# Patient Record
Sex: Male | Born: 1945 | Race: White | Hispanic: No | Marital: Married | Smoking: Former smoker
Health system: Southern US, Community
[De-identification: ages and names within clinical notes are randomized; demographics above are authoritative.]

## PROBLEM LIST (undated history)

## (undated) DIAGNOSIS — H35341 Macular cyst, hole, or pseudohole, right eye: Secondary | ICD-10-CM

## (undated) DIAGNOSIS — Z96652 Presence of left artificial knee joint: Secondary | ICD-10-CM

## (undated) DIAGNOSIS — H33011 Retinal detachment with single break, right eye: Secondary | ICD-10-CM

## (undated) DIAGNOSIS — E785 Hyperlipidemia, unspecified: Secondary | ICD-10-CM

## (undated) DIAGNOSIS — N400 Enlarged prostate without lower urinary tract symptoms: Secondary | ICD-10-CM

## (undated) DIAGNOSIS — R269 Unspecified abnormalities of gait and mobility: Secondary | ICD-10-CM

## (undated) DIAGNOSIS — R972 Elevated prostate specific antigen [PSA]: Secondary | ICD-10-CM

## (undated) DIAGNOSIS — H33321 Round hole, right eye: Secondary | ICD-10-CM

## (undated) DIAGNOSIS — G4730 Sleep apnea, unspecified: Secondary | ICD-10-CM

## (undated) DIAGNOSIS — N529 Male erectile dysfunction, unspecified: Secondary | ICD-10-CM

## (undated) DIAGNOSIS — M1711 Unilateral primary osteoarthritis, right knee: Secondary | ICD-10-CM

## (undated) HISTORY — DX: Male erectile dysfunction, unspecified: N52.9

## (undated) HISTORY — DX: Macular cyst, hole, or pseudohole, right eye: H35.341

## (undated) HISTORY — PX: RETINAL DETACHMENT SURGERY: SHX105

## (undated) HISTORY — PX: LIVER CYST REMOVAL: SHX5951

## (undated) HISTORY — PX: CATARACT EXTRACTION: SUR2

## (undated) HISTORY — DX: Unilateral primary osteoarthritis, right knee: M17.11

## (undated) HISTORY — DX: Benign prostatic hyperplasia without lower urinary tract symptoms: N40.0

## (undated) HISTORY — DX: Retinal detachment with single break, right eye: H33.011

## (undated) HISTORY — PX: HERNIA REPAIR: SHX51

## (undated) HISTORY — DX: Unspecified abnormalities of gait and mobility: R26.9

## (undated) HISTORY — DX: Presence of left artificial knee joint: Z96.652

## (undated) HISTORY — DX: Elevated prostate specific antigen (PSA): R97.20

## (undated) HISTORY — PX: OTHER SURGICAL HISTORY: SHX169

## (undated) HISTORY — DX: Hyperlipidemia, unspecified: E78.5

## (undated) HISTORY — DX: Round hole, right eye: H33.321

## (undated) HISTORY — DX: Sleep apnea, unspecified: G47.30

---

## 2004-09-27 ENCOUNTER — Ambulatory Visit: Payer: Self-pay | Admitting: Occupational Therapy

## 2009-04-16 ENCOUNTER — Ambulatory Visit: Payer: Self-pay | Admitting: Nurse Practitioner

## 2010-02-16 ENCOUNTER — Ambulatory Visit: Payer: Self-pay | Admitting: General Surgery

## 2010-02-16 IMAGING — CT CT ABDOMEN W/ CM
1 of 2 series · 15 of 32 positions shown, 19 images · non-contrast
Comparison: none

REASON FOR EXAM: INCISIONAL HERNIA WITH HEPATIC CYST
COMMENTS:

[Series 2: abd with 5.0 i40f · axial · 0.82mm/px · z∈[-906,-612]mm · 15 of 65 slices shown, 19 images]
[im 3/65  soft-tissue]
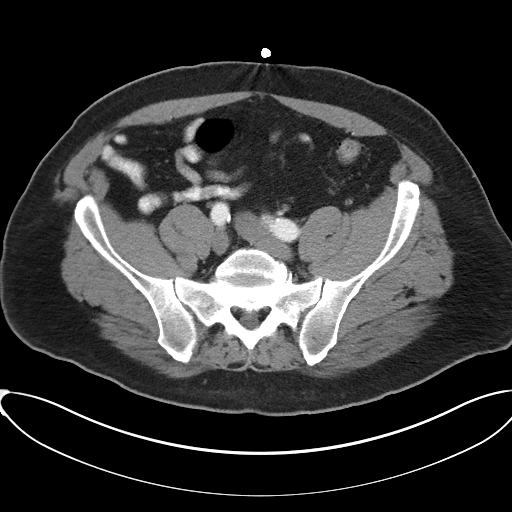
[im 3/65  bone]
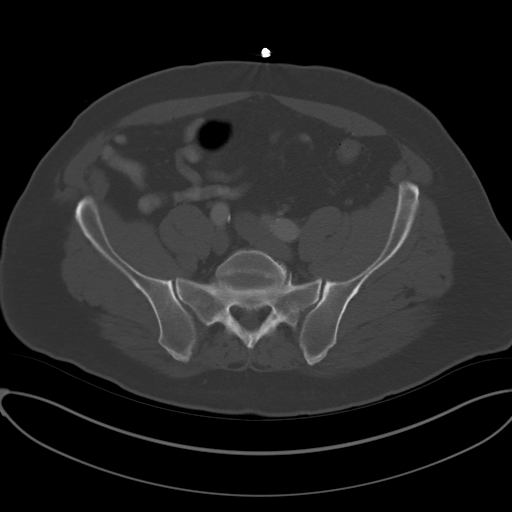
[im 9/65  soft-tissue]
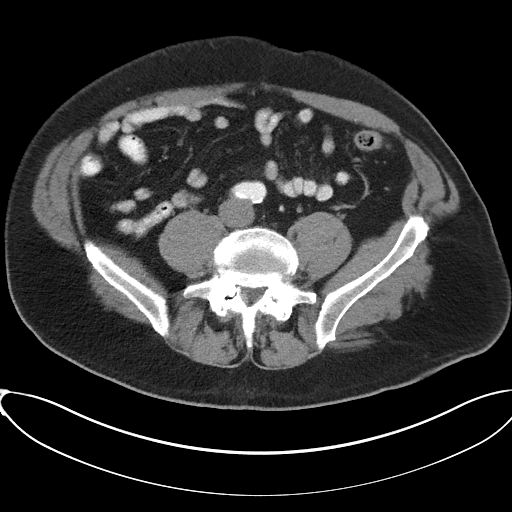
[im 14/65  soft-tissue]
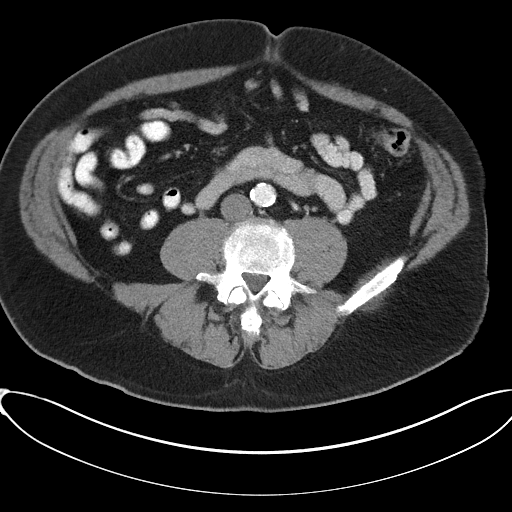
[im 17/65  soft-tissue]
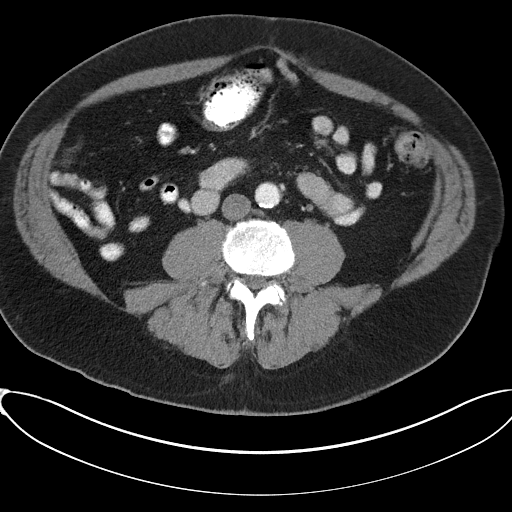
[im 23/65  soft-tissue]
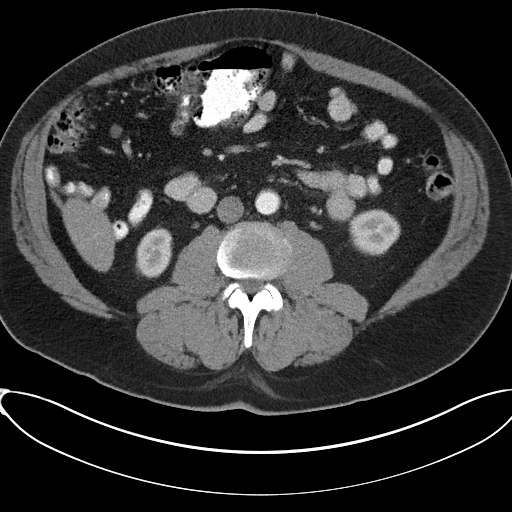
[im 28/65  soft-tissue]
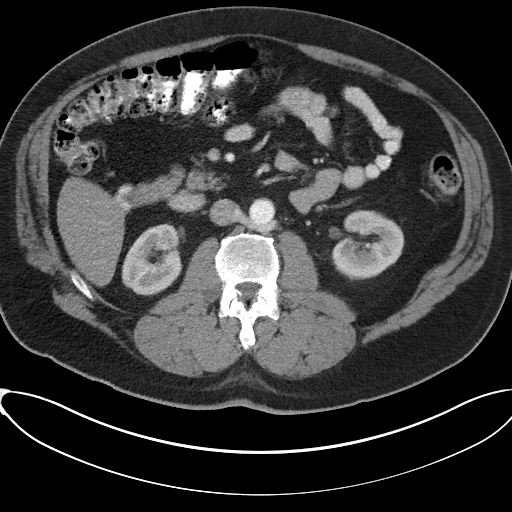
[im 34/65  soft-tissue]
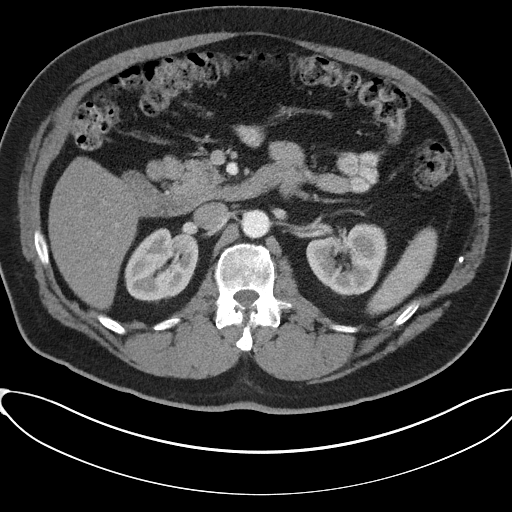
[im 37/65  soft-tissue]
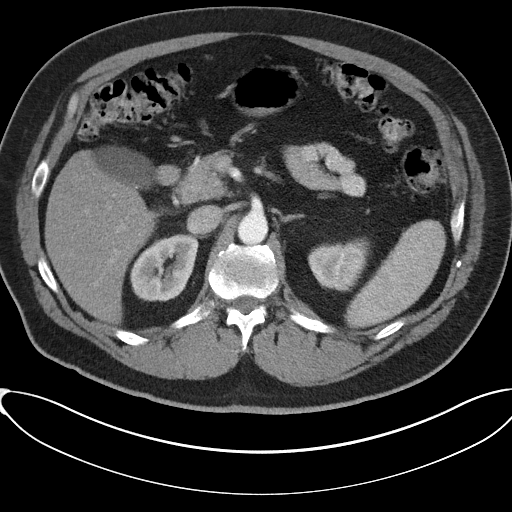
[im 42/65  soft-tissue]
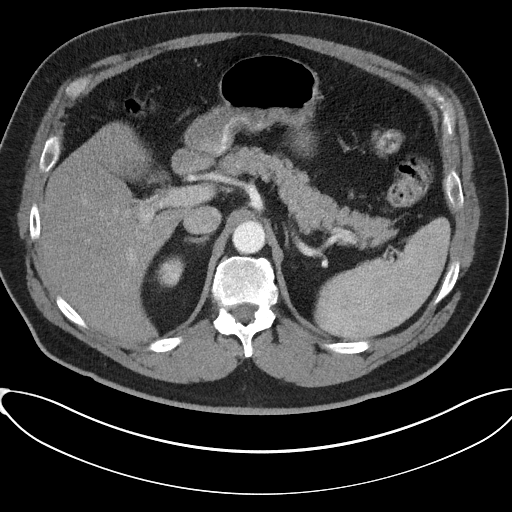
[im 42/65  bone]
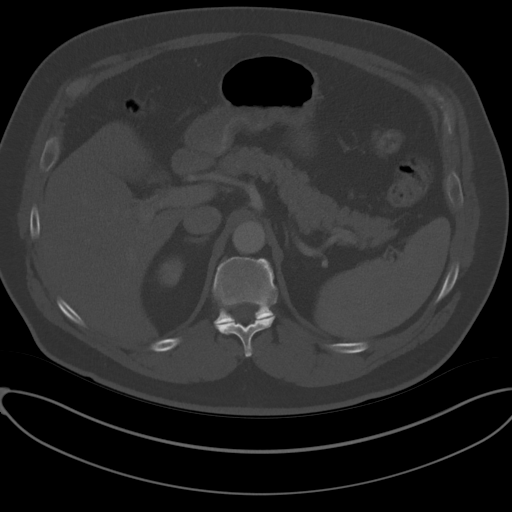
[im 48/65  soft-tissue]
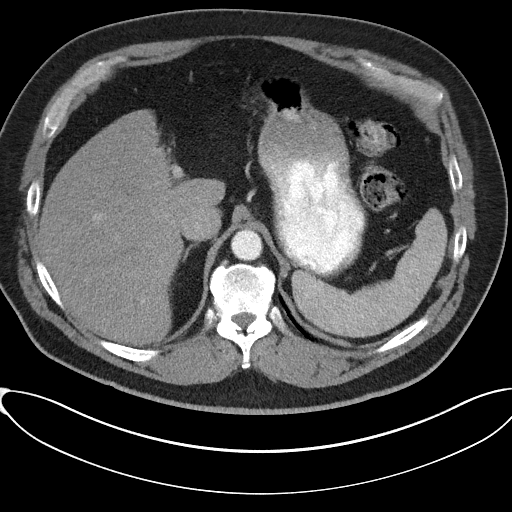
[im 51/65  soft-tissue]
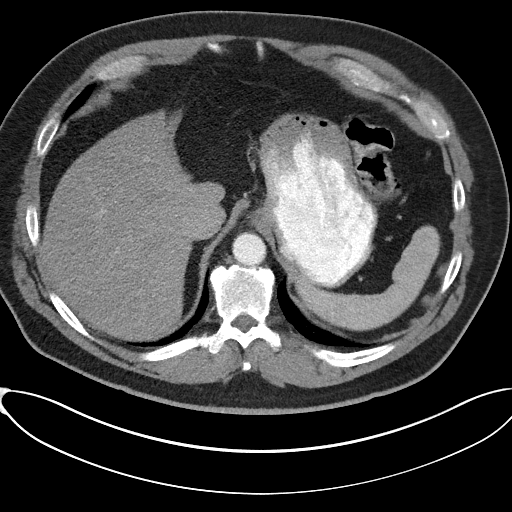
[im 53/65  lung]
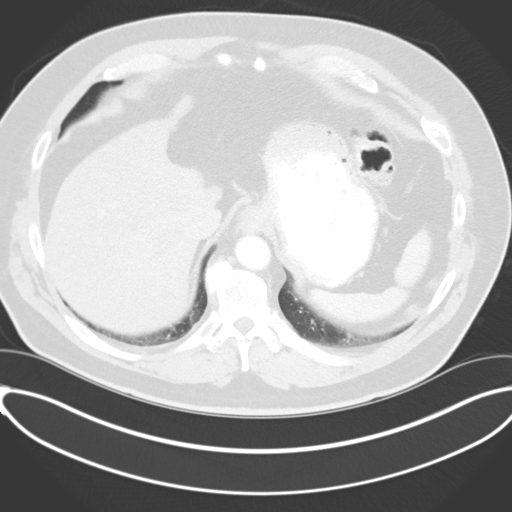
[im 56/65  soft-tissue]
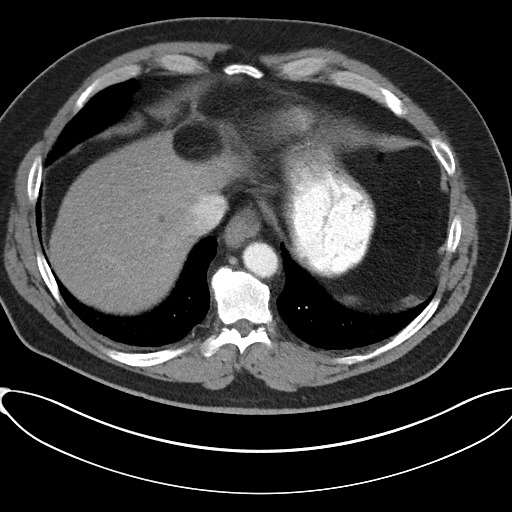
[im 56/65  lung]
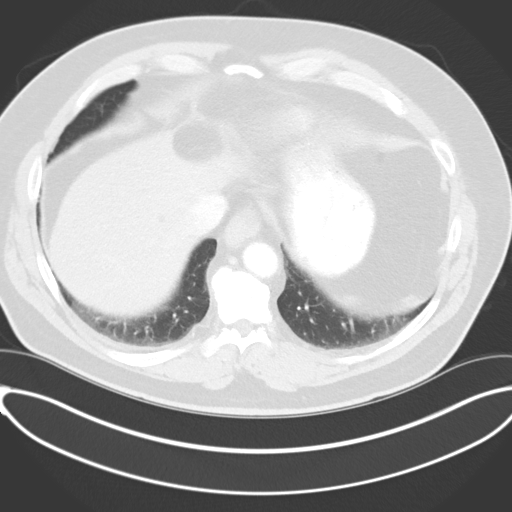
[im 59/65  lung]
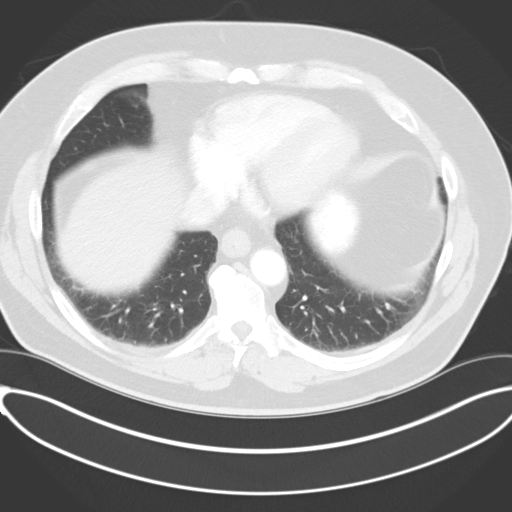
[im 62/65  soft-tissue]
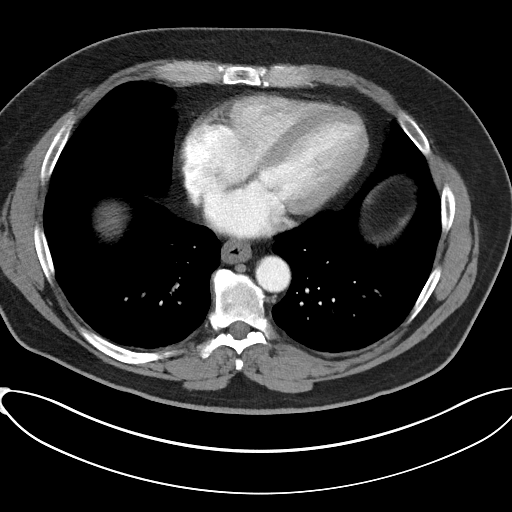
[im 62/65  lung]
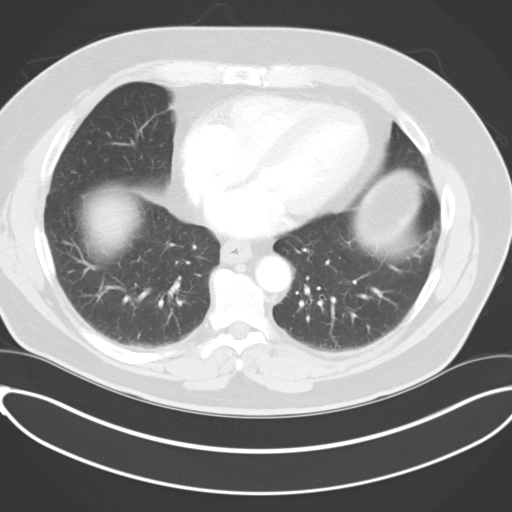

[15 of 32 positions shown; findings below may reference images not displayed]

PROCEDURE:     KCT - KCT ABDOMEN STANDARD W  - [DATE] [DATE]

RESULT:     Axial CT scanning was performed through the abdomen at 5 mm
intervals and slice thicknesses following intravenous administration of 100
cc of [CT]. Patient also received oral contrast material. Review of
multiplanar reconstructed images was performed separately on the VIA monitor.

Just to the left of midline demonstrated best on image 46 there is a small
ventral hernia with a neck measuring approximately 2.5 cm. It contains a
loop of normal appearing small bowel as well as peritoneal fat. It appears
separate from a nearby tiny umbilical hernia which contains only fat. The
bowel loops appear normal where visualized.

There are tiny hypodensities within the liver which have been demonstrated
on a prior CT scan in [DATE] which likely reflects cysts. There is a
tiny calcified gallstone in the mid portion of the gallbladder. The
previously demonstrated large left lobe hepatic cyst is no longer evident
with the patient having undergone partial left hepatic lobectomy. The
stomach, spleen, pancreas, and adrenal glands are normal in appearance. The
kidneys enhance well with no evidence obstruction or stones. There is a
retroaortic left renal vein. The caliber of the abdominal aorta is normal.
The lung bases exhibit no acute abnormality.
IMPRESSION: 1. There is a ventral hernia to the left of midline just superior to the
umbilicus but above the area marked with a metallic BB. This hernia is new
and contains a loop of small bowel. It exhibits a neck measuring
approximately 2.5 cm in diameter. The hernia itself is no deeper than
approximately 2 cm.
2. There is no evidence of incarcerated bowel.
3. There is a gallstone present.
4. There are postsurgical changes in the left lobe of the liver. Tiny stable
right hepatic lobe cysts are present.

## 2010-02-26 ENCOUNTER — Ambulatory Visit: Payer: Self-pay | Admitting: General Surgery

## 2010-03-02 ENCOUNTER — Ambulatory Visit: Payer: Self-pay | Admitting: General Surgery

## 2012-05-30 HISTORY — PX: COLONOSCOPY: SHX174

## 2012-09-24 ENCOUNTER — Ambulatory Visit: Payer: Self-pay | Admitting: Unknown Physician Specialty

## 2012-09-25 LAB — PATHOLOGY REPORT

## 2012-11-26 DIAGNOSIS — N5201 Erectile dysfunction due to arterial insufficiency: Secondary | ICD-10-CM | POA: Insufficient documentation

## 2012-11-26 DIAGNOSIS — N400 Enlarged prostate without lower urinary tract symptoms: Secondary | ICD-10-CM | POA: Insufficient documentation

## 2013-02-20 ENCOUNTER — Ambulatory Visit: Payer: Self-pay | Admitting: Hematology and Oncology

## 2013-02-20 LAB — CBC CANCER CENTER
Basophil %: 0.4 %
Eosinophil #: 0.1 x10 3/mm (ref 0.0–0.7)
HCT: 41.3 % (ref 40.0–52.0)
HGB: 14.2 g/dL (ref 13.0–18.0)
MCHC: 34.4 g/dL (ref 32.0–36.0)
Monocyte #: 0.4 x10 3/mm (ref 0.2–1.0)
Neutrophil #: 3.5 x10 3/mm (ref 1.4–6.5)
Neutrophil %: 73.5 %
RDW: 13.2 % (ref 11.5–14.5)
WBC: 4.7 x10 3/mm (ref 3.8–10.6)

## 2013-02-27 ENCOUNTER — Ambulatory Visit: Payer: Self-pay | Admitting: Hematology and Oncology

## 2013-03-06 LAB — CBC CANCER CENTER
Basophil #: 0 x10 3/mm (ref 0.0–0.1)
Basophil %: 0.7 %
Eosinophil #: 0.1 x10 3/mm (ref 0.0–0.7)
Eosinophil %: 2.3 %
HCT: 41.2 % (ref 40.0–52.0)
MCH: 31.2 pg (ref 26.0–34.0)
MCHC: 34.3 g/dL (ref 32.0–36.0)
Monocyte #: 0.5 x10 3/mm (ref 0.2–1.0)
Monocyte %: 8.7 %
Neutrophil #: 3.7 x10 3/mm (ref 1.4–6.5)
Neutrophil %: 71.3 %
Platelet: 128 x10 3/mm — ABNORMAL LOW (ref 150–440)
RBC: 4.52 10*6/uL (ref 4.40–5.90)
WBC: 5.2 x10 3/mm (ref 3.8–10.6)

## 2013-03-30 ENCOUNTER — Ambulatory Visit: Payer: Self-pay | Admitting: Hematology and Oncology

## 2013-07-01 DIAGNOSIS — G8929 Other chronic pain: Secondary | ICD-10-CM | POA: Insufficient documentation

## 2013-07-01 DIAGNOSIS — M25562 Pain in left knee: Secondary | ICD-10-CM

## 2014-11-10 DIAGNOSIS — H33321 Round hole, right eye: Secondary | ICD-10-CM

## 2014-11-10 HISTORY — DX: Round hole, right eye: H33.321

## 2015-02-09 DIAGNOSIS — M21161 Varus deformity, not elsewhere classified, right knee: Secondary | ICD-10-CM | POA: Insufficient documentation

## 2015-02-09 DIAGNOSIS — M21162 Varus deformity, not elsewhere classified, left knee: Secondary | ICD-10-CM | POA: Insufficient documentation

## 2015-02-09 DIAGNOSIS — E6609 Other obesity due to excess calories: Secondary | ICD-10-CM | POA: Insufficient documentation

## 2015-05-31 HISTORY — PX: COLONOSCOPY: SHX174

## 2015-06-25 DIAGNOSIS — G4730 Sleep apnea, unspecified: Secondary | ICD-10-CM

## 2015-06-25 HISTORY — DX: Sleep apnea, unspecified: G47.30

## 2015-07-03 DIAGNOSIS — M1711 Unilateral primary osteoarthritis, right knee: Secondary | ICD-10-CM

## 2015-07-03 DIAGNOSIS — R269 Unspecified abnormalities of gait and mobility: Secondary | ICD-10-CM

## 2015-07-03 HISTORY — DX: Unilateral primary osteoarthritis, right knee: M17.11

## 2015-07-03 HISTORY — DX: Unspecified abnormalities of gait and mobility: R26.9

## 2015-07-22 DIAGNOSIS — Z96652 Presence of left artificial knee joint: Secondary | ICD-10-CM

## 2015-07-22 HISTORY — DX: Presence of left artificial knee joint: Z96.652

## 2017-07-28 ENCOUNTER — Other Ambulatory Visit: Payer: Self-pay

## 2017-07-28 ENCOUNTER — Other Ambulatory Visit: Payer: Medicare Other

## 2017-07-28 DIAGNOSIS — N401 Enlarged prostate with lower urinary tract symptoms: Secondary | ICD-10-CM

## 2017-07-29 LAB — PSA: Prostate Specific Ag, Serum: 4.7 ng/mL — ABNORMAL HIGH (ref 0.0–4.0)

## 2017-08-01 ENCOUNTER — Telehealth: Payer: Self-pay

## 2017-08-01 DIAGNOSIS — R972 Elevated prostate specific antigen [PSA]: Secondary | ICD-10-CM

## 2017-08-01 NOTE — Telephone Encounter (Signed)
-----   Message from [name], MD sent at 07/30/2017 11:13 AM EST ----- PSA elevated above baseline at 4.7.  Recommend rechecking in 3-4 months

## 2017-08-01 NOTE — Telephone Encounter (Signed)
Letter sent and lab appt made. Orders placed.

## 2017-08-02 ENCOUNTER — Ambulatory Visit: Payer: Self-pay | Admitting: Urology

## 2017-09-12 ENCOUNTER — Other Ambulatory Visit: Payer: Self-pay

## 2018-01-11 DIAGNOSIS — H33011 Retinal detachment with single break, right eye: Secondary | ICD-10-CM

## 2018-01-11 DIAGNOSIS — H35341 Macular cyst, hole, or pseudohole, right eye: Secondary | ICD-10-CM

## 2018-01-11 HISTORY — DX: Retinal detachment with single break, right eye: H33.011

## 2018-01-11 HISTORY — DX: Macular cyst, hole, or pseudohole, right eye: H35.341

## 2018-01-16 ENCOUNTER — Ambulatory Visit: Payer: Medicare Other | Admitting: Urology

## 2018-02-15 ENCOUNTER — Encounter: Payer: Self-pay | Admitting: Urology

## 2018-02-15 ENCOUNTER — Ambulatory Visit: Payer: Medicare Other | Admitting: Urology

## 2018-02-15 VITALS — BP 131/72 | HR 81 | Ht 70.0 in | Wt 260.0 lb

## 2018-02-15 DIAGNOSIS — R972 Elevated prostate specific antigen [PSA]: Secondary | ICD-10-CM | POA: Diagnosis not present

## 2018-02-15 DIAGNOSIS — N401 Enlarged prostate with lower urinary tract symptoms: Secondary | ICD-10-CM

## 2018-02-15 NOTE — Progress Notes (Signed)
02/15/2018 2:19 PM   [name] April 26, 1946 [id number]  Referring provider: Renee Rival, NP [place], [place] [address]  Chief Complaint  Patient presents with  . Benign Prostatic Hypertrophy    transfer care    HPI: 72 year old male presents for annual follow-up.  I have previously seen him at [facility] for erectile dysfunction on generic sildenafil.  He has a history of an elevated PSA which returned to baseline on repeat.  He has no bothersome lower urinary tract symptoms.  PSA last year was mildly elevated at 4.12.  I had recommended a recheck at 6 months and it was slightly higher at 4.7.   PMH: Past Medical History:  Diagnosis Date  . Apnea, sleep 06/25/2015   Overview:  Uses CPAP  . BPH (benign prostatic hyperplasia)   . ED (erectile dysfunction)   . Elevated PSA   . 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 Gait abnormality 07/03/2015  . Hyperlipemia   . Primary osteoarthritis of right knee 07/03/2015  . 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 Round hole of retina of right eye 11/10/2014  . 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 CATARACT EXTRACTION    . LIVER CYST REMOVAL      Home Medications:  Allergies as of 02/15/2018   No Known Allergies     Medication List        Accurate as of 02/15/18  2:19 PM. Always use your most recent med list.          FML FORTE 0.25 % ophthalmic suspension Generic drug:  fluorometholone   naproxen sodium 220 MG tablet Commonly known as:  ALEVE Take by mouth.   ofloxacin 0.3 % ophthalmic solution Commonly known as:  OCUFLOX   prednisoLONE acetate 1 % ophthalmic suspension Commonly known as:  PRED FORTE Apply to eye.   sildenafil 20 MG tablet Commonly known as:  REVATIO 3 tablets 1 hour prior to intercourse as needed   timolol 0.5 % ophthalmic solution Commonly known as:   TIMOPTIC       Allergies: No Known Allergies  Family History: No family history on file.  Social History:  reports that he has quit smoking. He has never used smokeless tobacco. He reports that he drinks alcohol. He reports that he does not use drugs.  ROS: UROLOGY Frequent Urination?: No Hard to postpone urination?: No Burning/pain with urination?: No Get up at night to urinate?: Yes Leakage of urine?: No Urine stream starts and stops?: No Trouble starting stream?: No Do you have to strain to urinate?: No Blood in urine?: No Urinary tract infection?: No Sexually transmitted disease?: No Injury to kidneys or bladder?: No Painful intercourse?: No Weak stream?: No Erection problems?: No Penile pain?: No  Gastrointestinal Nausea?: No Vomiting?: No Indigestion/heartburn?: No Diarrhea?: No Constipation?: No  Constitutional Fever: No Night sweats?: No Weight loss?: No Fatigue?: No  Skin Skin rash/lesions?: No Itching?: No  Eyes Blurred vision?: No Double vision?: No  Ears/Nose/Throat Sore throat?: No Sinus problems?: No  Hematologic/Lymphatic Swollen glands?: No Easy bruising?: No  Cardiovascular Leg swelling?: No Chest pain?: No  Respiratory Cough?: No Shortness of breath?: No  Endocrine Excessive thirst?: No  Musculoskeletal Back pain?: No Joint pain?: No  Neurological Headaches?: No Dizziness?: No  Psychologic Depression?: No Anxiety?: No  Physical Exam: BP 131/72   Pulse 81  Ht 5\' 10"  (1.778 m)   Wt 260 lb (117.9 kg)   BMI 37.31 kg/m   Constitutional:  Alert and oriented, No acute distress. HEENT: [place] AT, moist mucus membranes.  Trachea midline, no masses. Cardiovascular: No clubbing, cyanosis, or edema. Respiratory: Normal respiratory effort, no increased work of breathing. GI: Abdomen is soft, nontender, nondistended, no abdominal masses GU: No CVA tenderness.  Prostate 45 g, smooth without nodules Lymph: No cervical or  inguinal lymphadenopathy. Skin: No rashes, bruises or suspicious lesions. Neurologic: Grossly intact, no focal deficits, moving all 4 extremities. Psychiatric: Normal mood and affect.   Assessment & Plan:   72 year old male with a slightly elevated but rising PSA.  DRE is benign.  PSA was repeated today and if continuing to rise discussed options of prostate biopsy, MRI and adjunctive blood testing.  I have recommended biopsy if it is continuing to rise.   [name], [facility] [address] [place], [place] [address] [phone]

## 2018-02-16 LAB — PSA: Prostate Specific Ag, Serum: 5.1 ng/mL — ABNORMAL HIGH (ref 0.0–4.0)

## 2018-02-20 ENCOUNTER — Telehealth: Payer: Self-pay

## 2018-02-20 NOTE — Telephone Encounter (Signed)
Called pt informed him of results pt gave verbal understanding. Pt transferred to reception for scheduling.

## 2018-02-20 NOTE — Telephone Encounter (Signed)
-----   Message from [name], MD sent at 02/18/2018 11:29 AM EDT ----- PSA is continuing to rise and was 5.1.  Recommend scheduling prostate biopsy.

## 2018-03-19 ENCOUNTER — Ambulatory Visit: Payer: Medicare Other | Admitting: Urology

## 2018-03-19 ENCOUNTER — Encounter: Payer: Self-pay | Admitting: Urology

## 2018-03-19 ENCOUNTER — Other Ambulatory Visit: Payer: Self-pay | Admitting: Urology

## 2018-03-19 VITALS — BP 160/72 | HR 65 | Ht 70.0 in | Wt 263.4 lb

## 2018-03-19 DIAGNOSIS — R972 Elevated prostate specific antigen [PSA]: Secondary | ICD-10-CM | POA: Diagnosis not present

## 2018-03-19 MED ORDER — LEVOFLOXACIN 500 MG PO TABS
500.0000 mg | ORAL_TABLET | Freq: Once | ORAL | Status: AC
  Administered 2018-03-19: 500 mg via ORAL

## 2018-03-19 MED ORDER — GENTAMICIN SULFATE 40 MG/ML IJ SOLN
80.0000 mg | Freq: Once | INTRAMUSCULAR | Status: AC
  Administered 2018-03-19: 80 mg via INTRAMUSCULAR

## 2018-03-19 NOTE — Progress Notes (Signed)
Prostate Biopsy Procedure   Informed consent was obtained after discussing risks/benefits of the procedure.  A time out was performed to ensure correct patient identity.  Pre-Procedure: - Last PSA Level: 5.1- 02/15/2018 - Gentamicin given prophylactically - Levaquin 500 mg administered PO -Transrectal Ultrasound performed revealing a 51 gm prostate -No significant hypoechoic or median lobe noted -Multiple prostatic calculi  Procedure: - Prostate block performed using 10 cc 1% lidocaine and biopsies taken from sextant areas, a total of 12 under ultrasound guidance.  Post-Procedure: - Patient tolerated the procedure well - He was counseled to seek immediate medical attention if experiences any severe pain, significant bleeding, or fevers - Return in one week to discuss biopsy results   John Giovanni, MD

## 2018-03-27 ENCOUNTER — Other Ambulatory Visit: Payer: Self-pay | Admitting: Urology

## 2018-03-27 LAB — PATHOLOGY REPORT

## 2018-04-02 ENCOUNTER — Ambulatory Visit: Payer: Medicare Other | Admitting: Urology

## 2018-04-03 ENCOUNTER — Ambulatory Visit (INDEPENDENT_AMBULATORY_CARE_PROVIDER_SITE_OTHER): Payer: Medicare Other | Admitting: Urology

## 2018-04-03 ENCOUNTER — Encounter: Payer: Self-pay | Admitting: Urology

## 2018-04-03 VITALS — BP 121/64 | HR 64 | Ht 70.0 in | Wt 263.4 lb

## 2018-04-03 DIAGNOSIS — R972 Elevated prostate specific antigen [PSA]: Secondary | ICD-10-CM | POA: Insufficient documentation

## 2018-04-03 DIAGNOSIS — N4231 Prostatic intraepithelial neoplasia: Secondary | ICD-10-CM

## 2018-04-03 DIAGNOSIS — N5201 Erectile dysfunction due to arterial insufficiency: Secondary | ICD-10-CM | POA: Diagnosis not present

## 2018-04-03 MED ORDER — SILDENAFIL CITRATE 20 MG PO TABS: ORAL_TABLET | ORAL | 1 refills | Status: DC

## 2018-04-03 NOTE — Progress Notes (Signed)
04/03/2018 2:09 PM   Ronnie Romero [birth year] [id number]  Referring provider: Renee Rival, NP [employer] [place], [place] [address]  Chief Complaint  Patient presents with  . Results    HPI: 72 year old male presents for prostate biopsy follow-up. Biopsy was performed on 03/19/2018 for PSA of 5.1.  Prostate volume was 51 g.  Standard 12 core biopsies were performed.  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  The remaining biopsies were negative some showing focal acute/chronic inflammation.  Refer to the pathology report for details.  PMH: Past Medical History:  Diagnosis Date  . Apnea, sleep 06/25/2015   Overview:  Uses CPAP  . BPH (benign prostatic hyperplasia)   . ED (erectile dysfunction)   . Elevated PSA   . 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 Gait abnormality 07/03/2015  . Hyperlipemia   . Primary osteoarthritis of right knee 07/03/2015  . 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 Round hole of retina of right eye 11/10/2014  . 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 CATARACT EXTRACTION    . LIVER CYST REMOVAL      Home Medications:  Allergies as of 04/03/2018   No Known Allergies     Medication List        Accurate as of 04/03/18  2:09 PM. Always use your most recent med list.          FML FORTE 0.25 % ophthalmic suspension Generic drug:  fluorometholone   naproxen sodium 220 MG tablet Commonly known as:  ALEVE Take by mouth.   ofloxacin 0.3 % ophthalmic solution Commonly known as:  OCUFLOX   prednisoLONE acetate 1 % ophthalmic suspension Commonly known as:  PRED FORTE Apply to eye.   sildenafil 20 MG tablet Commonly known as:  REVATIO 3 tablets 1 hour prior to intercourse as needed   timolol 0.5 % ophthalmic solution Commonly known as:  TIMOPTIC       Allergies:  No Known Allergies  Family History: No family history on file.  Social History:  reports that he has quit smoking. He has never used smokeless tobacco. He reports that he drinks alcohol. He reports that he does not use drugs.  ROS: UROLOGY Frequent Urination?: No Hard to postpone urination?: No Burning/pain with urination?: No Get up at night to urinate?: Yes Leakage of urine?: No Urine stream starts and stops?: No Trouble starting stream?: No Do you have to strain to urinate?: No Blood in urine?: No Urinary tract infection?: No Sexually transmitted disease?: No Injury to kidneys or bladder?: No Painful intercourse?: No Weak stream?: No Erection problems?: Yes Penile pain?: No  Gastrointestinal Nausea?: No Vomiting?: No Indigestion/heartburn?: No Diarrhea?: No Constipation?: No  Constitutional Fever: No Night sweats?: No Weight loss?: No Fatigue?: No  Skin Skin rash/lesions?: No Itching?: No  Eyes Blurred vision?: No Double vision?: No  Ears/Nose/Throat Sore throat?: No Sinus problems?: No  Hematologic/Lymphatic Swollen glands?: No Easy bruising?: No  Cardiovascular Leg swelling?: No Chest pain?: No  Respiratory Cough?: No Shortness of breath?: No  Endocrine Excessive thirst?: No  Musculoskeletal Back pain?: No Joint pain?: No  Neurological Headaches?: No Dizziness?: No  Psychologic Depression?: No Anxiety?: No  Physical Exam: BP 121/64 (BP Location: Left Arm, Patient Position: Sitting, Cuff Size: Large)   Pulse 64   Ht 5\' 10"  (1.778 m)  Wt 263 lb 6.4 oz (119.5 kg)   BMI 37.79 kg/m   Constitutional:  Alert and oriented, No acute distress.   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  The pathology report was discussed in detail with [name] and have recommended continuing monitoring.  Follow-up 6 months for a PSA/DRE.  He also requested an Rx for generic sildenafil which was sent.  Return in about  6 months (around 10/02/2018) for Recheck, PSA.   [name], [facility] [address], [place],  [address] [phone]

## 2018-10-03 ENCOUNTER — Ambulatory Visit: Payer: Medicare Other | Admitting: Urology

## 2018-11-14 ENCOUNTER — Ambulatory Visit: Payer: Medicare Other | Admitting: Urology

## 2018-11-27 ENCOUNTER — Ambulatory Visit: Payer: Medicare Other | Admitting: Urology

## 2018-11-27 ENCOUNTER — Other Ambulatory Visit: Payer: Self-pay

## 2018-11-27 ENCOUNTER — Encounter: Payer: Self-pay | Admitting: Urology

## 2018-11-27 VITALS — BP 135/76 | HR 61 | Ht 70.0 in | Wt 252.0 lb

## 2018-11-27 DIAGNOSIS — R972 Elevated prostate specific antigen [PSA]: Secondary | ICD-10-CM | POA: Diagnosis not present

## 2018-11-27 DIAGNOSIS — N4231 Prostatic intraepithelial neoplasia: Secondary | ICD-10-CM | POA: Diagnosis not present

## 2018-11-27 DIAGNOSIS — N5201 Erectile dysfunction due to arterial insufficiency: Secondary | ICD-10-CM | POA: Diagnosis not present

## 2018-11-27 MED ORDER — SILDENAFIL CITRATE 20 MG PO TABS: ORAL_TABLET | ORAL | 1 refills | Status: DC

## 2018-11-27 NOTE — Progress Notes (Signed)
11/27/2018 8:51 AM   Ronnie Romero [birth year] [id number]  Referring provider: Renee Rival, NP [place],  [place] [address]  Chief Complaint  Patient presents with  . Elevated PSA    Urologic history: 1.  Elevated PSA  -TRUS/biopsy 02/2018 PSA 5.1; benign DRE  -Volume 51 cc  -Path focal high-grade PIN; acute/chronic inflammation   2.  Erectile dysfunction  -On sildenafil  3.  BPH without lower urinary tract symptoms   HPI: 73 year old male presents for six-month follow-up of an elevated PSA.  Since his last visit he states he has been doing well.  He denies bothersome lower urinary tract symptoms.  Denies dysuria, gross hematuria or flank/abdominal/pelvic/scrotal pain.   PMH: Past Medical History:  Diagnosis Date  . Apnea, sleep 06/25/2015   Overview:  Uses CPAP  . BPH (benign prostatic hyperplasia)   . ED (erectile dysfunction)   . Elevated PSA   . 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 Gait abnormality 07/03/2015  . Hyperlipemia   . Primary osteoarthritis of right knee 07/03/2015  . 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 Round hole of retina of right eye 11/10/2014  . 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 CATARACT EXTRACTION    . LIVER CYST REMOVAL      Home Medications:  Allergies as of 11/27/2018   No Known Allergies     Medication List       Accurate as of November 27, 2018  8:51 AM. If you have any questions, ask your nurse or doctor.        STOP taking these medications   ofloxacin 0.3 % ophthalmic solution Commonly known as: OCUFLOX Stopped by: [name], MD   prednisoLONE acetate 1 % ophthalmic suspension Commonly known as: PRED FORTE Stopped by: [name], MD   timolol 0.5 % ophthalmic solution Commonly known as: TIMOPTIC Stopped by: [name], MD     TAKE these medications    FML Forte 0.25 % ophthalmic suspension Generic drug: fluorometholone   naproxen sodium 220 MG tablet Commonly known as: ALEVE Take by mouth.   sildenafil 20 MG tablet Commonly known as: REVATIO 3 tablets 1 hour prior to intercourse as needed       Allergies: No Known Allergies  Family History: History reviewed. No pertinent family history.  Social History:  reports that he has quit smoking. He has never used smokeless tobacco. He reports current alcohol use. He reports that he does not use drugs.  ROS: UROLOGY Frequent Urination?: No Hard to postpone urination?: No Burning/pain with urination?: No Get up at night to urinate?: No Leakage of urine?: No Urine stream starts and stops?: No Trouble starting stream?: No Do you have to strain to urinate?: No Blood in urine?: No Urinary tract infection?: No Sexually transmitted disease?: No Injury to kidneys or bladder?: No Painful intercourse?: No Weak stream?: No Erection problems?: No Penile pain?: No  Gastrointestinal Nausea?: No Vomiting?: No Indigestion/heartburn?: No Diarrhea?: No Constipation?: No  Constitutional Fever: No Night sweats?: No Weight loss?: No Fatigue?: No  Skin Skin rash/lesions?: No Itching?: No  Eyes Blurred vision?: No Double vision?: No  Ears/Nose/Throat Sore throat?: No Sinus problems?: No  Hematologic/Lymphatic Swollen glands?: No Easy bruising?: No  Cardiovascular Leg swelling?: No Chest pain?: No  Respiratory Cough?: No Shortness of breath?: No  Endocrine Excessive  thirst?: No  Musculoskeletal Back pain?: No Joint pain?: No  Neurological Headaches?: No Dizziness?: No  Psychologic Depression?: No Anxiety?: No  Physical Exam: BP 135/76 (BP Location: Left Arm, Patient Position: Sitting, Cuff Size: Normal)   Pulse 61   Ht 5\' 10"  (1.778 m)   Wt 252 lb (114.3 kg)   BMI 36.16 kg/m   Constitutional:  Alert and oriented, No acute distress. HEENT: [place] AT,  moist mucus membranes.  Trachea midline, no masses. Cardiovascular: No clubbing, cyanosis, or edema. Respiratory: Normal respiratory effort, no increased work of breathing. GI: Abdomen is soft, nontender, nondistended, no abdominal masses GU: No CVA tenderness.  Prostate 50 g, smooth without nodules. Lymph: No cervical or inguinal lymphadenopathy. Skin: No rashes, bruises or suspicious lesions. Neurologic: Grossly intact, no focal deficits, moving all 4 extremities. Psychiatric: Normal mood and affect.    Assessment & Plan:   73 year old male with an elevated PSA and biopsy showing a focus of high-grade PIN.  DRE today is benign.  PSA was drawn he will be notified with results.  If his PSA is stable I have recommended a lab visit for PSA in 6 months and follow-up visit 1 year.  He requested a refill on sildenafil.   [name], [facility] [address] [place], [place] [address] [phone]

## 2018-11-28 LAB — PSA: Prostate Specific Ag, Serum: 6.4 ng/mL — ABNORMAL HIGH (ref 0.0–4.0)

## 2018-11-29 ENCOUNTER — Telehealth: Payer: Self-pay

## 2018-11-29 ENCOUNTER — Other Ambulatory Visit: Payer: Self-pay | Admitting: Urology

## 2018-11-29 DIAGNOSIS — R972 Elevated prostate specific antigen [PSA]: Secondary | ICD-10-CM

## 2018-11-29 NOTE — Telephone Encounter (Signed)
Called pt informed him of the information below. Pt gave verbal understanding. PSA ordered. Lab appt scheduled.

## 2018-11-29 NOTE — Telephone Encounter (Signed)
-----   Message from [name], MD sent at 11/29/2018  8:15 AM EDT ----- PSA has increased to 2.4.  Recc repeating in 2 months

## 2019-01-30 ENCOUNTER — Other Ambulatory Visit: Payer: Medicare Other

## 2019-01-30 ENCOUNTER — Other Ambulatory Visit: Payer: Self-pay

## 2019-01-30 DIAGNOSIS — R972 Elevated prostate specific antigen [PSA]: Secondary | ICD-10-CM

## 2019-01-31 ENCOUNTER — Telehealth: Payer: Self-pay | Admitting: *Deleted

## 2019-01-31 LAB — PSA: Prostate Specific Ag, Serum: 5.8 ng/mL — ABNORMAL HIGH (ref 0.0–4.0)

## 2019-01-31 NOTE — Telephone Encounter (Signed)
-----   Message from [name], MD sent at 01/31/2019  8:23 AM EDT ----- Repeat PSA better at 5.8.  Follow-up as scheduled

## 2019-01-31 NOTE — Telephone Encounter (Signed)
Notified patient as instructed, patient pleased. Discussed follow-up appointments, patient agrees  

## 2019-06-07 ENCOUNTER — Other Ambulatory Visit: Payer: Medicare Other

## 2019-06-14 ENCOUNTER — Other Ambulatory Visit: Payer: Self-pay | Admitting: Family Medicine

## 2019-06-14 DIAGNOSIS — R972 Elevated prostate specific antigen [PSA]: Secondary | ICD-10-CM

## 2019-06-17 ENCOUNTER — Other Ambulatory Visit: Payer: Self-pay

## 2019-06-17 ENCOUNTER — Other Ambulatory Visit: Payer: Medicare PPO

## 2019-06-17 DIAGNOSIS — R972 Elevated prostate specific antigen [PSA]: Secondary | ICD-10-CM

## 2019-06-18 LAB — PSA: Prostate Specific Ag, Serum: 6.1 ng/mL — ABNORMAL HIGH (ref 0.0–4.0)

## 2019-06-19 ENCOUNTER — Telehealth: Payer: Self-pay | Admitting: *Deleted

## 2019-06-19 NOTE — Telephone Encounter (Signed)
-----   Message from [name], MD sent at 06/18/2019  7:53 PM EST ----- PSA was 6.1.  Follow-up as scheduled

## 2019-06-19 NOTE — Telephone Encounter (Signed)
Pt called office and I read message

## 2019-12-04 ENCOUNTER — Other Ambulatory Visit: Payer: Medicare Other

## 2019-12-06 ENCOUNTER — Ambulatory Visit: Payer: Medicare Other | Admitting: Urology

## 2019-12-07 NOTE — Progress Notes (Signed)
12/09/2019 11:45 AM   Ronnie Romero 1945-10-03 [id number]  Referring provider: Renee Rival, NP [place],  [place] [address] Chief Complaint  Patient presents with   Elevated PSA    Urologic history: 1.  Elevated PSA             -TRUS/biopsy 02/2018 PSA 5.1; benign DRE             -Volume 51 cc             -Path focal high-grade PIN; acute/chronic inflammation              2.  Erectile dysfunction             -On sildenafil  3.  BPH without lower urinary tract symptoms  HPI: Ronnie Romero is a 74 y.o. male presents for 1 year follow-up of an elevated PSA.  -He is voiding well -He has nocturia x 3-4 nightly; not bothersome -Denies dysuria, or flank/abdominal/pelvic/scrotal pain. 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 If you have any questions, ask your nurse or doctor.        amitriptyline 10 MG tablet Commonly known as:  ELAVIL   FML Forte 0.25 % ophthalmic suspension Generic drug: fluorometholone   naproxen sodium 220 MG tablet Commonly known as: ALEVE Take by mouth.   sildenafil 20 MG tablet Commonly known as: REVATIO 3 tablets 1 hour prior to intercourse as needed       Allergies: No Known Allergies  Family History: History reviewed. No pertinent family history.  Social History:  reports that he has quit smoking. He has never used smokeless tobacco. He reports current alcohol use. He reports that he does not use drugs.   Physical Exam: BP (!) 150/94    Pulse 71    Ht 5\' 10"  (1.778 m)    Wt 252 lb (114.3 kg)    BMI 36.16 kg/m   Constitutional:  Alert and oriented, No acute distress. HEENT: [place] AT, moist mucus membranes.  Trachea midline, no masses. Cardiovascular: No clubbing, cyanosis, or edema. Respiratory: Normal respiratory effort, no increased work of breathing. GI: Abdomen is soft, nontender, nondistended, no abdominal masses GU: Prostate 50 g,  smooth without nodules Skin: No rashes, bruises or suspicious lesions. Neurologic: Grossly intact, no focal deficits, moving all 4 extremities. Psychiatric: Normal mood and affect.   Assessment & Plan:    1. Elevated PSA  -Most recent PSA was 6.1 as of 06/17/2019.  -PSA today, will call with results.  -biopsy on 03/19/2018 showed a focus of high-grade PIN.  2. Erectile dysfunction  -Patient would like a 90 day refill of sildenafil.   Follow up in 1 year with PSA.   [facility] [address] [place], [place] [address] [phone]  I, [name], am acting as a scribe for Dr. [name],  I have reviewed the above documentation for accuracy and completeness, and I agree with the above.   [name], MD

## 2019-12-09 ENCOUNTER — Ambulatory Visit: Payer: Medicare PPO | Admitting: Urology

## 2019-12-09 ENCOUNTER — Other Ambulatory Visit: Payer: Self-pay

## 2019-12-09 ENCOUNTER — Encounter: Payer: Self-pay | Admitting: Urology

## 2019-12-09 VITALS — BP 150/94 | HR 71 | Ht 70.0 in | Wt 252.0 lb

## 2019-12-09 DIAGNOSIS — R972 Elevated prostate specific antigen [PSA]: Secondary | ICD-10-CM

## 2019-12-09 DIAGNOSIS — N5201 Erectile dysfunction due to arterial insufficiency: Secondary | ICD-10-CM | POA: Diagnosis not present

## 2019-12-09 MED ORDER — SILDENAFIL CITRATE 20 MG PO TABS: ORAL_TABLET | ORAL | 1 refills | Status: DC

## 2019-12-10 ENCOUNTER — Telehealth: Payer: Self-pay | Admitting: Urology

## 2019-12-10 DIAGNOSIS — R972 Elevated prostate specific antigen [PSA]: Secondary | ICD-10-CM

## 2019-12-10 LAB — PSA: Prostate Specific Ag, Serum: 7.7 ng/mL — ABNORMAL HIGH (ref 0.0–4.0)

## 2019-12-10 NOTE — Telephone Encounter (Signed)
PSA has increased to 7.7.  Recommend scheduling prostate MRI.  Order was entered and recommend follow-up appointment to discuss results

## 2019-12-11 NOTE — Telephone Encounter (Signed)
PA has been done ok to schedule follow up for results for two weeks out  [place]

## 2019-12-11 NOTE — Telephone Encounter (Signed)
Waiting on pa

## 2020-01-08 ENCOUNTER — Other Ambulatory Visit: Payer: Self-pay

## 2020-01-08 ENCOUNTER — Ambulatory Visit
Admission: RE | Admit: 2020-01-08 | Discharge: 2020-01-08 | Disposition: A | Discharge status: Home / Self Care | Payer: Medicare PPO | Source: Ambulatory Visit | Attending: Urology | Admitting: Urology

## 2020-01-08 DIAGNOSIS — R972 Elevated prostate specific antigen [PSA]: Secondary | ICD-10-CM | POA: Insufficient documentation

## 2020-01-08 MED ORDER — GADOBUTROL 1 MMOL/ML IV SOLN
10.0000 mL | Freq: Once | INTRAVENOUS | Status: AC | PRN
  Administered 2020-01-08: 10 mL via INTRAVENOUS

## 2020-01-10 ENCOUNTER — Ambulatory Visit (INDEPENDENT_AMBULATORY_CARE_PROVIDER_SITE_OTHER): Payer: Medicare PPO | Admitting: Urology

## 2020-01-10 ENCOUNTER — Other Ambulatory Visit: Payer: Self-pay

## 2020-01-10 ENCOUNTER — Encounter: Payer: Self-pay | Admitting: Urology

## 2020-01-10 VITALS — BP 153/85 | HR 69 | Ht 70.0 in | Wt 249.0 lb

## 2020-01-10 DIAGNOSIS — R935 Abnormal findings on diagnostic imaging of other abdominal regions, including retroperitoneum: Secondary | ICD-10-CM | POA: Diagnosis not present

## 2020-01-10 DIAGNOSIS — R972 Elevated prostate specific antigen [PSA]: Secondary | ICD-10-CM

## 2020-01-10 NOTE — Progress Notes (Signed)
01/10/2020 12:19 PM   Ronnie Romero Aug 28, 1945 [id number]  Referring provider: Renee Rival, NP [place],  [place] [address]  Chief Complaint  Patient presents with  . Follow-up    MRI results    Urologic history: 1.Elevated PSA -TRUS/biopsy 02/2018 PSA 5.1; benign DRE -Volume 51 cc -Path focal high-grade PIN; acute/chronic inflammation  2.Erectile dysfunction -On sildenafil  3.BPH without lower urinary tract symptoms   HPI: 74 y.o. male presents for MRI follow-up.   Seen last month for routine follow-up  PSA increased 7.7  Prostate MRI recommended  MRI remarkable for 51 cc gland  PI-RADS 4 lesion extreme right anterior apex of the PZ  No extracapsular abnormalities, seminal vesicle involvement, pelvic adenopathy or bony abnormalities of the pelvis      PMH: Past Medical History:  Diagnosis Date  . Apnea, sleep 06/25/2015   Overview:  Uses CPAP  . BPH (benign prostatic hyperplasia)   . ED (erectile dysfunction)   . Elevated PSA   . 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 Gait abnormality 07/03/2015  . Hyperlipemia   . Primary osteoarthritis of right knee 07/03/2015  . 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 Round hole of retina of right eye 11/10/2014  . 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 CATARACT EXTRACTION    . LIVER CYST REMOVAL      Home Medications:  Allergies as of 01/10/2020   No Known Allergies     Medication List       Accurate as of January 10, 2020 12:19 PM. If you have any questions, ask your nurse or doctor.        amitriptyline 10 MG tablet Commonly known as: ELAVIL   FML Forte 0.25 % ophthalmic suspension Generic drug: fluorometholone   naproxen sodium 220 MG tablet Commonly known as: ALEVE Take by mouth.     sildenafil 20 MG tablet Commonly known as: REVATIO 3 tablets 1 hour prior to intercourse as needed       Allergies: No Known Allergies  Family History: History reviewed. No pertinent family history.  Social History:  reports that he has quit smoking. He has never used smokeless tobacco. He reports current alcohol use. He reports that he does not use drugs.   Physical Exam: BP (!) 153/85   Pulse 69   Ht 5\' 10"  (1.778 m)   Wt 249 lb (112.9 kg)   BMI 35.73 kg/m   Constitutional:  Alert and oriented, No acute distress. HEENT: [place] AT, moist mucus membranes.  Trachea midline, no masses. Cardiovascular: No clubbing, cyanosis, or edema. Respiratory: Normal respiratory effort, no increased work of breathing. Neurologic: Grossly intact, no focal deficits, moving all 4 extremities. Psychiatric: Normal mood and affect.   Assessment & Plan:    1.  Elevated PSA  PSA bump to 7.7 with prostate MRI showing a PI-RADS 4 lesion  MRI findings were discussed in detail with Ronnie Romero and his wife  We discussed that PI-RADS 4 lesions are suspicious for high-grade prostate cancer  Recommended scheduling MR fusion biopsy at [facility] in [place]  The procedure was discussed in detail including potential risks of bleeding and infection/sepsis  He will return here for the pathology report  All questions were answered and he desires to proceed   [name], MD  [facility] [address]  [place], [place] [address] [phone]

## 2020-02-26 ENCOUNTER — Other Ambulatory Visit: Payer: Self-pay | Admitting: Urology

## 2020-03-05 NOTE — Progress Notes (Signed)
Virtual Visit via telephone note  I connected with Ronnie Romero on 03/06/2020 at  8:00 AM EDT by telephone and verified that I am speaking with the correct person using two identifiers.  Location: Patient: Home Provider: Office   I discussed the limitations of evaluation and management by telemedicine and the availability of in person appointments. The patient expressed understanding and agreed to proceed.  Urologic history: 1.Elevated PSA -TRUS/biopsy 02/2018 PSA 5.1; benign DRE -Volume 51 cc -Path focal high-grade PIN; acute/chronic inflammation  2.Erectile dysfunction -On sildenafil  3.BPH without lower urinary tract symptoms  History of Present Illness: Ronnie Romero is a 74 y.o. male who returns today for discussion regarding prostate biopsy results    PSA increased 7.7  Prostate MRI recommended  MRI remarkable for 51 cc gland  PI-RADS 4 lesion extreme right anterior apex of the PZ  No extracapsular abnormalities, seminal vesicle involvement, pelvic adenopathy or bony abnormalities of the pelvis  He had no post biopsy complaints  Fusion bx performed on 02/21/2020 remarkable for a 54 g prostate.  4 biopsies were taken of the ROI along with a standard 12 core template  2/4 ROI biopsies positive for Gleason 3+4 adenocarcinoma involving 10% of the submitted tissue with pattern 4 involving 5% of submitted tissue.        Observations/Objective: The patient is engaged an asking good questions.   Assessment and Plan:  1.  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  We discussed potential management options including radical prostatectomy and radiation modalities of IMRT and brachytherapy.  The pros and cons of each treatment were discussed in detail.  We discussed that there is not considered a "best" treatment for  prostate cancer and that radiation and surgery are generally considered equivalent treatments.  We discussed the slightly higher chance of recurrence with radiation after 10-15 years.  Although active surveillance is not typically performed for intermediate risk disease he had minimal pattern 4 pathology and surveillance is a consideration based on today's age however would recommend further genomic testing prior to proceeding with active surveillance.   Follow Up Instructions: He is interested in active surveillance and will send slides to [employer] for further genomic testing.  He was also interested in an appointment in radiation oncology to discuss radiation therapy.   I discussed the assessment and treatment plan with the patient. The patient was provided an opportunity to ask questions and all were answered. The patient agreed with the plan and demonstrated an understanding of the instructions.   The patient was advised to call back or seek an in-person evaluation if the symptoms worsen or if the condition fails to improve as anticipated.  I provided 24 minutes of non-face-to-face time during this encounter.   Fransico Him, am acting as a scribe for Dr. Nicki Reaper C. Wafa Martes,  I have reviewed the above documentation for accuracy and completeness, and I agree with the above.   [name], MD

## 2020-03-06 ENCOUNTER — Telehealth (INDEPENDENT_AMBULATORY_CARE_PROVIDER_SITE_OTHER): Payer: Medicare PPO | Admitting: Urology

## 2020-03-06 ENCOUNTER — Other Ambulatory Visit: Payer: Self-pay

## 2020-03-06 DIAGNOSIS — C61 Malignant neoplasm of prostate: Secondary | ICD-10-CM

## 2020-03-09 ENCOUNTER — Encounter: Payer: Self-pay | Admitting: Urology

## 2020-03-12 ENCOUNTER — Telehealth: Payer: Self-pay

## 2020-03-12 NOTE — Telephone Encounter (Signed)
Order placed online with [employer] portal.

## 2020-03-12 NOTE — Telephone Encounter (Signed)
-----   Message from [name], MD sent at 03/08/2020  2:21 PM EDT ----- Regarding: Genomic testing Hi Ronnie Romero, Please order GPS testing through [employer] on this patient's pathology.  Thanks

## 2020-03-30 NOTE — Telephone Encounter (Signed)
Pt's wife is calling wanting to know results from the testing or if the testing has came back? Please advise

## 2020-03-30 NOTE — Telephone Encounter (Signed)
Fax was received from company requesting more information.  It has not been run yet

## 2020-04-02 NOTE — Telephone Encounter (Signed)
Notes and updates were faxed and confirmation was received via phone PA in progress

## 2020-04-21 NOTE — Telephone Encounter (Signed)
Results received and sent via scanned result

## 2020-04-22 ENCOUNTER — Other Ambulatory Visit: Payer: Self-pay | Admitting: Urology

## 2020-05-05 ENCOUNTER — Encounter: Payer: Self-pay | Admitting: Urology

## 2020-08-17 ENCOUNTER — Encounter: Payer: Self-pay | Admitting: Internal Medicine

## 2020-09-09 ENCOUNTER — Other Ambulatory Visit: Payer: Self-pay

## 2020-09-09 ENCOUNTER — Ambulatory Visit: Payer: Medicare PPO | Admitting: Urology

## 2020-09-09 ENCOUNTER — Encounter: Payer: Self-pay | Admitting: Urology

## 2020-09-09 VITALS — BP 150/80 | HR 63 | Ht 70.0 in | Wt 249.0 lb

## 2020-09-09 DIAGNOSIS — R972 Elevated prostate specific antigen [PSA]: Secondary | ICD-10-CM

## 2020-09-09 DIAGNOSIS — C61 Malignant neoplasm of prostate: Secondary | ICD-10-CM

## 2020-09-09 NOTE — Progress Notes (Signed)
   09/09/2020 11:43 AM   Ronnie Romero March 13, 1946 [id number]  Referring provider: Renee Rival, NP [address],  [place] [address]  Chief Complaint  Patient presents with  . Prostate Cancer    Urologic history: 1.T1c intermediate risk prostate cancer -TRUS/biopsy 02/2018 PSA 5.1; benign DRE -Volume 51 cc -Path focal high-grade PIN; acute/chronic inflammation  -PSA 7.7 11/2019; MRI PI-RADS 4 lesion  -Fusion biopsy 01/2020 2/4 ROI biopsies w/Gleason 3+4  -Desired genetic testing and GPS score 17  -Elected active surveillance   2.Erectile dysfunction -On sildenafil  3.BPH without lower urinary tract symptoms   HPI: 75 y.o. male presents for 6 month follow-up.   No bothersome LUTS though has noted occasional urinary urgency; rare episodes slight dribbling prior to voiding  No dysuria, gross hematuria  No flank, abdominal or pelvic pain  PMH: Past Medical History:  Diagnosis Date  . Apnea, sleep 06/25/2015   Overview:  Uses CPAP  . BPH (benign prostatic hyperplasia)   . ED (erectile dysfunction)   . Elevated PSA   . 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 Gait abnormality 07/03/2015  . Hyperlipemia   . Primary osteoarthritis of right knee 07/03/2015  . 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 Round hole of retina of right eye 11/10/2014  . 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 CATARACT EXTRACTION    . LIVER CYST REMOVAL      Home Medications:  Allergies as of 09/09/2020   No Known Allergies     Medication List       Accurate as of September 09, 2020 11:43 AM. If you have any questions, ask your nurse or doctor.        amitriptyline 10 MG tablet Commonly known as: ELAVIL   FML Forte 0.25 % ophthalmic suspension Generic drug: fluorometholone   naproxen  sodium 220 MG tablet Commonly known as: ALEVE Take by mouth.   sildenafil 20 MG tablet Commonly known as: REVATIO 3 tablets 1 hour prior to intercourse as needed       Allergies: No Known Allergies  Family History: History reviewed. No pertinent family history.  Social History:  reports that he has quit smoking. He has never used smokeless tobacco. He reports current alcohol use. He reports that he does not use drugs.   Physical Exam: BP (!) 150/80   Pulse 63   Ht 5\' 10"  (1.778 m)   Wt 249 lb (112.9 kg)   BMI 35.73 kg/m   Constitutional:  Alert and oriented, No acute distress. HEENT: [place] AT, moist mucus membranes.  Trachea midline, no masses. Cardiovascular: No clubbing, cyanosis, or edema. Respiratory: Normal respiratory effort, no increased work of breathing. GU: Prostate 50 g, smooth without nodules Neurologic: Grossly intact, no focal deficits, moving all 4 extremities. Psychiatric: Normal mood and affect.   Assessment & Plan:    1.  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

## 2020-09-10 ENCOUNTER — Encounter: Payer: Self-pay | Admitting: Urology

## 2020-09-10 DIAGNOSIS — C61 Malignant neoplasm of prostate: Secondary | ICD-10-CM | POA: Insufficient documentation

## 2020-09-10 LAB — PSA: Prostate Specific Ag, Serum: 7.7 ng/mL — ABNORMAL HIGH (ref 0.0–4.0)

## 2020-09-12 ENCOUNTER — Encounter: Payer: Self-pay | Admitting: Urology

## 2020-09-29 ENCOUNTER — Other Ambulatory Visit: Payer: Self-pay | Admitting: *Deleted

## 2020-09-29 ENCOUNTER — Encounter: Payer: Self-pay | Admitting: Gastroenterology

## 2020-09-29 ENCOUNTER — Encounter: Payer: Self-pay | Admitting: *Deleted

## 2020-09-29 ENCOUNTER — Ambulatory Visit: Payer: Medicare PPO | Admitting: Gastroenterology

## 2020-09-29 ENCOUNTER — Other Ambulatory Visit: Payer: Self-pay

## 2020-09-29 ENCOUNTER — Telehealth: Payer: Self-pay | Admitting: *Deleted

## 2020-09-29 DIAGNOSIS — Z86010 Personal history of colonic polyps: Secondary | ICD-10-CM

## 2020-09-29 MED ORDER — PEG 3350-KCL-NA BICARB-NACL 420 G PO SOLR: ORAL | 0 refills | Status: DC

## 2020-09-29 NOTE — Telephone Encounter (Signed)
PA approved via [employer]. Auth# [id number] DOS 11/13/2020-12/13/2020

## 2020-09-29 NOTE — Patient Instructions (Signed)
Great to meet you today! Go [employer]!  We are arranging a colonoscopy in the near future with Dr. Abbey Chatters.   Further recommendations to follow!  It was a pleasure to see you today. I want to create trusting relationships with patients to provide genuine, compassionate, and quality care. I value your feedback. If you receive a survey regarding your visit,  I greatly appreciate you taking time to fill this out.   Annitta Needs, PhD, ANP-BC [facility]

## 2020-09-29 NOTE — Progress Notes (Signed)
Primary Care Physician:  Renee Rival, NP  Referring Provider: Angelina Ok, NP Primary Gastroenterologist:  Dr. Abbey Chatters  Chief Complaint  Patient presents with  . Colonoscopy    Last tcs 5 years ago at [facility]. History of polyps    HPI:   Ronnie Romero is a 75 y.o. male presenting today at the request of Angelina Ok, NP, for surveillance colonoscopy. He has a history of polyps, with tubulovillous adenoma in 2014 and last colonoscopy 2017 normal at [facility].   No abdominal pain, N/V, GERD, dysphagia, rectal bleeding, unexplained weight loss, or lack of appetite. He has no GI concerns today.    Past Medical History:  Diagnosis Date  . Apnea, sleep 06/25/2015   Overview:  Uses CPAP  . BPH (benign prostatic hyperplasia)   . ED (erectile dysfunction)   . Elevated PSA   . 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 Gait abnormality 07/03/2015  . Hyperlipemia   . Primary osteoarthritis of right knee 07/03/2015  . 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 Round hole of retina of right eye 11/10/2014  . 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 CATARACT EXTRACTION    . COLONOSCOPY  2017   normal at [facility].   Marland Kitchen COLONOSCOPY  2014   tubulovillous adenoma at outside facility  . cornea surgery bilaterally    . LIVER CYST REMOVAL    . RETINAL DETACHMENT SURGERY     bilaterally    Current Outpatient Medications  Medication Sig Dispense Refill  . amitriptyline (ELAVIL) 10 MG tablet Take 10 mg by mouth at bedtime.    Marland Kitchen FML FORTE 0.25 % ophthalmic suspension Place 1 drop into both eyes daily.  11  . naproxen sodium (ALEVE) 220 MG tablet Take 220 mg by mouth as needed.    . polyethylene glycol-electrolytes (NULYTELY) 420 g solution As directed 4000 mL 0  . sildenafil (REVATIO) 20 MG tablet 3 tablets 1 hour prior to intercourse as needed 90 tablet 1   No current  facility-administered medications for this visit.    Allergies as of 09/29/2020  . (No Known Allergies)    Family History  Problem Relation Age of Onset  . Colon cancer Neg Hx     Social History   Socioeconomic History  . Marital status: Married    Spouse name: Not on file  . Number of children: Not on file  . Years of education: Not on file  . Highest education level: Not on file  Occupational History  . Not on file  Tobacco Use  . Smoking status: Former Research scientist (life sciences)  . Smokeless tobacco: Never Used  Vaping Use  . Vaping Use: Never used  Substance and Sexual Activity  . Alcohol use: Yes    Comment: glass of wine every 2 weeks  . Drug use: Never  . Sexual activity: Yes    Birth control/protection: None  Other Topics Concern  . Not on file  Social History Narrative  . Not on file   Social Determinants of Health   Financial Resource Strain: Not on file  Food Insecurity: Not on file  Transportation Needs: Not on file  Physical Activity: Not on file  Stress: Not on file  Social Connections: Not on file  Intimate Partner Violence: Not on file    Review of Systems: Gen: Denies any fever, chills, fatigue, weight  loss, lack of appetite.  CV: Denies chest pain, heart palpitations, peripheral edema, syncope.  Resp: Denies shortness of breath at rest or with exertion. Denies wheezing or cough.  GI: see HPI GU : Denies urinary burning, urinary frequency, urinary hesitancy MS: Denies joint pain, muscle weakness, cramps, or limitation of movement.  Derm: Denies rash, itching, dry skin Psych: Denies depression, anxiety, memory loss, and confusion Heme: Denies bruising, bleeding, and enlarged lymph nodes.  Physical Exam: BP 128/80   Pulse (!) 58   Temp (!) 97.3 F (36.3 C) (Temporal)   Ht 5\' 10"  (1.778 m)   Wt 256 lb 3.2 oz (116.2 kg)   BMI 36.76 kg/m  General:   Alert and oriented. Pleasant and cooperative. Well-nourished and well-developed.  Head:  Normocephalic and  atraumatic. Eyes:  Without icterus, sclera clear and conjunctiva pink.  Ears:  Normal auditory acuity. Mouth:  Mask in place Lungs:  Clear to auscultation bilaterally. No wheezes, rales, or rhonchi. No distress.  Heart:  S1, S2 present without murmurs appreciated.  Abdomen:  +BS, soft, small umbilical hernia and non-distended. No HSM noted. No guarding or rebound. No masses appreciated.  Rectal:  Deferred  Msk:  Symmetrical without gross deformities. Normal posture. Extremities:  Without edema. Neurologic:  Alert and  oriented x4;  grossly normal neurologically. Skin:  Intact without significant lesions or rashes. Psych:  Alert and cooperative. Normal mood and affect.  ASSESSMENT: CAMP GOPAL is a 75 y.o. male presenting today with history of polyps in remote past (tubulovillous adenoma in 2014), last colonoscopy in 2017 at [facility], and due for routine surveillance now. He is in excellent health and has no concerning upper or lower GI signs/symptoms. Denies any family history of colorectal cancer.    PLAN: Proceed with colonoscopy by Dr. Abbey Chatters  in near future: the risks, benefits, and alternatives have been discussed with the patient in detail. The patient states understanding and desires to proceed.   Further recommendations to follow  Annitta Needs, PhD, ANP-BC [facility]

## 2020-11-04 ENCOUNTER — Telehealth: Payer: Self-pay | Admitting: Internal Medicine

## 2020-11-04 MED ORDER — NA SULFATE-K SULFATE-MG SULF 17.5-3.13-1.6 GM/177ML PO SOLN: 1.0000 | Freq: Once | ORAL | 0 refills | Status: AC

## 2020-11-04 NOTE — Telephone Encounter (Signed)
Called pt and spoke with spouse. They requested to have suprep called in instead of trilyte. Rx sent in. Advised will send instructions to [facility]. Nothing further needed

## 2020-11-04 NOTE — Telephone Encounter (Signed)
Pt has questions about his prep. [phone]

## 2020-11-11 ENCOUNTER — Other Ambulatory Visit (HOSPITAL_COMMUNITY): Payer: Medicare PPO

## 2020-11-13 ENCOUNTER — Encounter (HOSPITAL_COMMUNITY): Payer: Self-pay

## 2020-11-13 ENCOUNTER — Encounter (HOSPITAL_COMMUNITY)
Admission: RE | Disposition: A | Discharge status: Home / Self Care | Payer: Self-pay | Source: Home / Self Care | Attending: Internal Medicine

## 2020-11-13 ENCOUNTER — Ambulatory Visit (HOSPITAL_COMMUNITY): Payer: Medicare PPO | Admitting: Certified Registered Nurse Anesthetist

## 2020-11-13 ENCOUNTER — Ambulatory Visit (HOSPITAL_COMMUNITY)
Admission: RE | Admit: 2020-11-13 | Discharge: 2020-11-13 | Disposition: A | Discharge status: Home / Self Care | Payer: Medicare PPO | Attending: Internal Medicine | Admitting: Internal Medicine

## 2020-11-13 ENCOUNTER — Other Ambulatory Visit: Payer: Self-pay

## 2020-11-13 DIAGNOSIS — Z79899 Other long term (current) drug therapy: Secondary | ICD-10-CM | POA: Insufficient documentation

## 2020-11-13 DIAGNOSIS — K648 Other hemorrhoids: Secondary | ICD-10-CM | POA: Insufficient documentation

## 2020-11-13 DIAGNOSIS — Z96652 Presence of left artificial knee joint: Secondary | ICD-10-CM | POA: Insufficient documentation

## 2020-11-13 DIAGNOSIS — Z86010 Personal history of colonic polyps: Secondary | ICD-10-CM | POA: Diagnosis not present

## 2020-11-13 DIAGNOSIS — Q438 Other specified congenital malformations of intestine: Secondary | ICD-10-CM | POA: Insufficient documentation

## 2020-11-13 DIAGNOSIS — Z87891 Personal history of nicotine dependence: Secondary | ICD-10-CM | POA: Diagnosis not present

## 2020-11-13 DIAGNOSIS — K5730 Diverticulosis of large intestine without perforation or abscess without bleeding: Secondary | ICD-10-CM | POA: Insufficient documentation

## 2020-11-13 DIAGNOSIS — Z1211 Encounter for screening for malignant neoplasm of colon: Secondary | ICD-10-CM | POA: Diagnosis not present

## 2020-11-13 HISTORY — PX: COLONOSCOPY WITH PROPOFOL: SHX5780

## 2020-11-13 SURGERY — COLONOSCOPY WITH PROPOFOL
Anesthesia: General

## 2020-11-13 MED ORDER — PROPOFOL 500 MG/50ML IV EMUL
INTRAVENOUS | Status: DC | PRN
  Administered 2020-11-13: 125 ug/kg/min via INTRAVENOUS

## 2020-11-13 MED ORDER — PROPOFOL 10 MG/ML IV BOLUS
INTRAVENOUS | Status: DC | PRN
  Administered 2020-11-13: 100 mg via INTRAVENOUS

## 2020-11-13 MED ORDER — STERILE WATER FOR IRRIGATION IR SOLN
Status: DC | PRN
  Administered 2020-11-13: 1.5 mL

## 2020-11-13 MED ORDER — LACTATED RINGERS IV SOLN
INTRAVENOUS | Status: DC
  Administered 2020-11-13: 1000 mL via INTRAVENOUS

## 2020-11-13 NOTE — Transfer of Care (Signed)
Immediate Anesthesia Transfer of Care Note  Patient: Ronnie Romero  Procedure(s) Performed: COLONOSCOPY WITH PROPOFOL  Patient Location: Short Stay  Anesthesia Type:General  Level of Consciousness: awake and alert   Airway & Oxygen Therapy: Patient Spontanous Breathing  Post-op Assessment: Report given to RN and Post -op Vital signs reviewed and stable  Post vital signs: Reviewed and stable  Last Vitals:  Vitals Value Taken Time  BP    Temp    Pulse    Resp    SpO2      Last Pain:  Vitals:   11/13/20 0842  TempSrc:   PainSc: 0-No pain      Patients Stated Pain Goal: 5 (63/78/58 8502)  Complications: No notable events documented.

## 2020-11-13 NOTE — H&P (Signed)
Primary Care Physician:  Renee Rival, NP Primary Gastroenterologist:  Dr. Abbey Chatters  Pre-Procedure History & Physical: HPI:  Ronnie Romero is a 75 y.o. male is here for surveillance colonoscopy. He has a history of polyps, with tubulovillous adenoma in 2014 and last colonoscopy 2017 normal at [facility]. No abdominal pain, N/V, GERD, dysphagia, rectal bleeding, unexplained weight loss, or lack of appetite. He has no GI concerns today.   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    COLONOSCOPY  2014   tubulovillous adenoma at outside facility   cornea surgery bilaterally     HERNIA REPAIR Left    LIVER CYST REMOVAL     RETINAL DETACHMENT SURGERY     bilaterally    Prior to Admission medications   Medication Sig Start Date End Date Taking? Authorizing Provider  acetaminophen (TYLENOL) 500 MG tablet Take 500 mg by mouth daily as needed for moderate pain.   Yes [provider]  amitriptyline (ELAVIL) 10 MG tablet Take 20 mg by mouth at bedtime. 11/28/19  Yes [provider]  FML FORTE 0.25 % ophthalmic suspension Place 1 drop into both eyes daily. 12/26/17  Yes [provider]  naproxen sodium (ALEVE) 220 MG tablet Take 220 mg by mouth daily as needed (pain).   Yes [provider]  polyethylene glycol-electrolytes (NULYTELY) 420 g solution As directed 09/29/20   Eloise Harman, DO  sildenafil (REVATIO) 20  MG tablet 3 tablets 1 hour prior to intercourse as needed Patient taking differently: Take 60 mg by mouth See admin instructions. 3 tablets 1 hour prior to intercourse as needed 12/09/19  Yes Stoioff, Ronda Fairly, MD    Allergies as of 09/29/2020   (No Known Allergies)    Family History  Problem Relation Age of Onset   Colon cancer Neg Hx     Social History   Socioeconomic History   Marital status: Married    Spouse name: Not on file   Number of children: Not on file   Years of education: Not on file   Highest education level: Not on file  Occupational History   Not on file  Tobacco Use   Smoking status: Former    Pack years: 0.00   Smokeless tobacco: Never  Vaping Use   Vaping Use: Never used  Substance and Sexual Activity   Alcohol use: Yes    Comment: glass of wine every 2 weeks   Drug use: Never   Sexual activity: Yes    Birth control/protection: None  Other Topics Concern   Not on file  Social History Narrative   Not on file   Social Determinants of Health   Financial Resource Strain: Not on file  Food Insecurity: Not on file  Transportation Needs: Not on file  Physical Activity: Not on file  Stress: Not on file  Social Connections: Not on file  Intimate Partner Violence: Not on file    Review of Systems: See HPI, otherwise negative ROS  Physical Exam: Vital signs in last 24 hours: Temp:  [97.8 F (36.6 C)] 97.8 F (36.6 C) (06/17 0759) Pulse Rate:  [65] 65 (06/17 0759) Resp:  [16] 16 (06/17 0759) BP: (148)/(75) 148/75 (06/17 0759) SpO2:  [97 %] 97 % (06/17 0759) Weight:  [112.9 kg] 112.9 kg (06/17 0759)   General:   Alert,  Well-developed, well-nourished, pleasant and cooperative in NAD Head:  Normocephalic and atraumatic. Eyes:  Sclera clear, no icterus.   Conjunctiva pink. Ears:  Normal auditory acuity. Nose:  No deformity, discharge,  or lesions. Mouth:  No deformity or lesions, dentition normal. Neck:  Supple; no masses or  thyromegaly. Lungs:  Clear throughout to auscultation.   No wheezes, crackles, or rhonchi. No acute distress. Heart:  Regular rate and rhythm; no murmurs, clicks, rubs,  or gallops. Abdomen:  Soft, nontender and nondistended. No masses, hepatosplenomegaly or hernias noted. Normal bowel sounds, without guarding, and without rebound.   Msk:  Symmetrical without gross deformities. Normal posture. Extremities:  Without clubbing or edema. Neurologic:  Alert and  oriented x4;  grossly normal neurologically. Skin:  Intact without significant lesions or rashes. Cervical Nodes:  No significant cervical adenopathy. Psych:  Alert and cooperative. Normal mood and affect.  Impression/Plan: Ronnie Romero is here ffor surveillance colonoscopy. He has a history of polyps, with tubulovillous adenoma in 2014 and last colonoscopy 2017 normal at [facility].  The risks of the procedure including infection, bleed, or perforation as well as benefits, limitations, alternatives and imponderables have been reviewed with the patient. Questions have been answered. All parties agreeable.

## 2020-11-13 NOTE — Op Note (Signed)
[facility] Patient Name: Ronnie Romero Procedure Date: 11/13/2020 8:28 AM MRN: [id number] Date of Birth: [birth year] Attending MD: Elon Alas. Abbey Chatters DO CSN: [id number] Age: 75 Admit Type: Outpatient Procedure:                Colonoscopy Indications:              History of tubulovillous adenoma Providers:                Elon Alas. Abbey Chatters, DO, Charlsie Quest. Theda Sers RN, RN,                            Aram Candela Referring MD:              Medicines:                See the Anesthesia note for documentation of the                            administered medications Complications:            No immediate complications. Estimated Blood Loss:     Estimated blood loss: none. Procedure:                Pre-Anesthesia Assessment:                           - The anesthesia plan was to use monitored                            anesthesia care (MAC).                           After obtaining informed consent, the colonoscope                            was passed under direct vision. Throughout the                            procedure, the patient's blood pressure, pulse, and                            oxygen saturations were monitored continuously. The                            PCF-HQ190L ([id number]) scope was introduced through                            the anus and advanced to the the cecum, identified                            by appendiceal orifice and ileocecal valve. The                            colonoscopy was performed without difficulty. The                            patient tolerated the procedure well. The quality  of the bowel preparation was evaluated using the                            BBPS [facility] Bowel Preparation Scale) with scores                            of: Right Colon = 2 (minor amount of residual                            staining, small fragments of stool and/or opaque                            liquid, but mucosa seen well),  Transverse Colon = 3                            (entire mucosa seen well with no residual staining,                            small fragments of stool or opaque liquid) and Left                            Colon = 3 (entire mucosa seen well with no residual                            staining, small fragments of stool or opaque                            liquid). The total BBPS score equals 8. The quality                            of the bowel preparation was good. Scope In: 8:41:06 AM Scope Out: 8:56:17 AM Scope Withdrawal Time: 0 hours 8 minutes 20 seconds  Total Procedure Duration: 0 hours 15 minutes 11 seconds  Findings:      The perianal and digital rectal examinations were normal.      Non-bleeding internal hemorrhoids were found during endoscopy.      A few small-mouthed diverticula were found in the sigmoid colon.      The colon (entire examined portion) was moderately redundant. Advancing       the scope required applying abdominal pressure.      The colon (entire examined portion) revealed excessive looping. Impression:               - Non-bleeding internal hemorrhoids.                           - Diverticulosis in the sigmoid colon.                           - Redundant colon.                           - There was significant looping of the colon.                           -  No specimens collected. Moderate Sedation:      Per Anesthesia Care Recommendation:           - Patient has a contact number available for                            emergencies. The signs and symptoms of potential                            delayed complications were discussed with the                            patient. Return to normal activities tomorrow.                            Written discharge instructions were provided to the                            patient.                           - Resume previous diet.                           - Continue present medications.                            - Repeat colonoscopy in 5 years for surveillance if                            potential benefits outweigh risks.                           - Return to GI clinic PRN. Procedure Code(s):        --- Professional ---                           [phone], Colonoscopy, flexible; diagnostic, including                            collection of specimen(s) by brushing or washing,                            when performed (separate procedure) Diagnosis Code(s):        --- Professional ---                           K64.8, Other hemorrhoids                           K57.30, Diverticulosis of large intestine without                            perforation or abscess without bleeding CPT copyright 2019 American Medical Association. All rights reserved. The codes documented in this report are preliminary and upon coder review may  be revised to meet current compliance requirements. Elon Alas. Abbey Chatters, DO  Elon Alas. Abbey Chatters, DO 11/13/2020 9:02:19 AM This report has been signed electronically. Number of Addenda: 0

## 2020-11-13 NOTE — Addendum Note (Signed)
Addendum  created 11/13/20 1322 by Karna Dupes, CRNA   Charge Capture section accepted

## 2020-11-13 NOTE — Anesthesia Preprocedure Evaluation (Signed)
Anesthesia Evaluation  Patient identified by MRN, date of birth, ID band Patient awake    Reviewed: Allergy & Precautions, H&P , NPO status , Patient's Chart, lab work & pertinent test results, reviewed documented beta blocker date and time   Airway Mallampati: II  TM Distance: >3 FB Neck ROM: full    Dental no notable dental hx.    Pulmonary sleep apnea , former smoker,    Pulmonary exam normal breath sounds clear to auscultation       Cardiovascular Exercise Tolerance: Good negative cardio ROS   Rhythm:regular Rate:Normal     Neuro/Psych negative neurological ROS  negative psych ROS   GI/Hepatic negative GI ROS, Neg liver ROS,   Endo/Other  negative endocrine ROS  Renal/GU negative Renal ROS  negative genitourinary   Musculoskeletal   Abdominal   Peds  Hematology negative hematology ROS (+)   Anesthesia Other Findings   Reproductive/Obstetrics negative OB ROS                             Anesthesia Physical Anesthesia Plan  ASA: 3  Anesthesia Plan: General   Post-op Pain Management:    Induction:   PONV Risk Score and Plan: Propofol infusion  Airway Management Planned:   Additional Equipment:   Intra-op Plan:   Post-operative Plan:   Informed Consent: I have reviewed the patients History and Physical, chart, labs and discussed the procedure including the risks, benefits and alternatives for the proposed anesthesia with the patient or authorized representative who has indicated his/her understanding and acceptance.     Dental Advisory Given  Plan Discussed with: CRNA  Anesthesia Plan Comments:         Anesthesia Quick Evaluation

## 2020-11-13 NOTE — Anesthesia Postprocedure Evaluation (Signed)
Anesthesia Post Note  Patient: Ronnie Romero  Procedure(s) Performed: COLONOSCOPY WITH PROPOFOL  Patient location during evaluation: Phase II Anesthesia Type: General Level of consciousness: awake Pain management: pain level controlled Vital Signs Assessment: post-procedure vital signs reviewed and stable Respiratory status: spontaneous breathing and respiratory function stable Cardiovascular status: blood pressure returned to baseline and stable Postop Assessment: no headache and no apparent nausea or vomiting Anesthetic complications: no Comments: Late entry   No notable events documented.   Last Vitals:  Vitals:   11/13/20 0759 11/13/20 0859  BP: (!) 148/75 111/62  Pulse: 65   Resp: 16 (!) 22  Temp: 36.6 C 36.6 C  SpO2: 97% 95%    Last Pain:  Vitals:   11/13/20 0859  TempSrc: Oral  PainSc: 0-No pain                 Louann Sjogren

## 2020-11-13 NOTE — Discharge Instructions (Addendum)
  Colonoscopy Discharge Instructions  Read the instructions outlined below and refer to this sheet in the next few weeks. These discharge instructions provide you with general information on caring for yourself after you leave the hospital. Your doctor may also give you specific instructions. While your treatment has been planned according to the most current medical practices available, unavoidable complications occasionally occur.   ACTIVITY You may resume your regular activity, but move at a slower pace for the next 24 hours.  Take frequent rest periods for the next 24 hours.  Walking will help get rid of the air and reduce the bloated feeling in your belly (abdomen).  No driving for 24 hours (because of the medicine (anesthesia) used during the test).   Do not sign any important legal documents or operate any machinery for 24 hours (because of the anesthesia used during the test).  NUTRITION Drink plenty of fluids.  You may resume your normal diet as instructed by your doctor.  Begin with a light meal and progress to your normal diet. Heavy or fried foods are harder to digest and may make you feel sick to your stomach (nauseated).  Avoid alcoholic beverages for 24 hours or as instructed.  MEDICATIONS You may resume your normal medications unless your doctor tells you otherwise.  WHAT YOU CAN EXPECT TODAY Some feelings of bloating in the abdomen.  Passage of more gas than usual.  Spotting of blood in your stool or on the toilet paper.  IF YOU HAD POLYPS REMOVED DURING THE COLONOSCOPY: No aspirin products for 7 days or as instructed.  No alcohol for 7 days or as instructed.  Eat a soft diet for the next 24 hours.  FINDING OUT THE RESULTS OF YOUR TEST Not all test results are available during your visit. If your test results are not back during the visit, make an appointment with your caregiver to find out the results. Do not assume everything is normal if you have not heard from your  caregiver or the medical facility. It is important for you to follow up on all of your test results.  SEEK IMMEDIATE MEDICAL ATTENTION IF: You have more than a spotting of blood in your stool.  Your belly is swollen (abdominal distention).  You are nauseated or vomiting.  You have a temperature over 101.  You have abdominal pain or discomfort that is severe or gets worse throughout the day.   Your colonoscopy was relatively unremarkable.  I did not find any polyps or evidence of colon cancer.  In 5 years we can discuss risks vs benefits of repeating colonoscopy. You do have diverticulosis and internal hemorrhoids. I would recommend increasing fiber in your diet or adding OTC Benefiber/Metamucil. Be sure to drink at least 4 to 6 glasses of water daily. Follow-up with GI as needed.   I hope you have a great rest of your week!  Elon Alas. Abbey Chatters, D.O. Gastroenterology and Hepatology [facility]

## 2020-11-19 ENCOUNTER — Encounter (HOSPITAL_COMMUNITY): Payer: Self-pay | Admitting: Internal Medicine

## 2020-12-09 ENCOUNTER — Ambulatory Visit: Payer: Self-pay | Admitting: Urology

## 2021-03-15 ENCOUNTER — Ambulatory Visit: Payer: Self-pay | Admitting: Urology

## 2021-03-26 ENCOUNTER — Other Ambulatory Visit: Payer: Self-pay

## 2021-03-26 ENCOUNTER — Encounter: Payer: Self-pay | Admitting: Urology

## 2021-03-26 ENCOUNTER — Ambulatory Visit: Payer: Medicare PPO | Admitting: Urology

## 2021-03-26 VITALS — BP 130/80 | HR 74 | Ht 70.0 in | Wt 250.0 lb

## 2021-03-26 DIAGNOSIS — C61 Malignant neoplasm of prostate: Secondary | ICD-10-CM | POA: Diagnosis not present

## 2021-03-26 DIAGNOSIS — N5201 Erectile dysfunction due to arterial insufficiency: Secondary | ICD-10-CM

## 2021-03-26 MED ORDER — SILDENAFIL CITRATE 20 MG PO TABS: ORAL_TABLET | ORAL | 1 refills | Status: DC

## 2021-03-26 NOTE — Progress Notes (Signed)
03/26/2021 11:00 AM   Ronnie Romero [birth year] [id number]  Referring provider: Renee Rival, NP [place],  [place] [address]  Chief Complaint  Patient presents with   Prostate Cancer      Urologic history: 1.  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  Erectile dysfunction             -On sildenafil   3.  BPH without lower urinary tract symptoms   HPI: 75 y.o. male presents for semiannual follow-up.  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    COLONOSCOPY  2014   tubulovillous adenoma at outside facility   COLONOSCOPY WITH PROPOFOL N/A 11/13/2020   Procedure: COLONOSCOPY WITH PROPOFOL;  Surgeon: Eloise Harman, DO;  [facility];  Service: Endoscopy;  Laterality: N/A;  9:30am   cornea surgery  bilaterally     HERNIA REPAIR Left    LIVER CYST REMOVAL     RETINAL DETACHMENT SURGERY     bilaterally    Home Medications:  Allergies as of 03/26/2021   No Known Allergies      Medication List        Accurate as of March 26, 2021 11:00 AM. If you have any questions, ask your nurse or doctor.          STOP taking these medications    amitriptyline 10 MG tablet Commonly known as: ELAVIL Stopped by: [name], MD   polyethylene glycol-electrolytes 420 g solution Commonly known as: NuLYTELY Stopped by: [name], MD       TAKE these medications    acetaminophen 500 MG tablet Commonly known as: TYLENOL Take 500 mg by mouth daily as needed for moderate pain.   FML Forte 0.25 % ophthalmic suspension Generic drug: fluorometholone Place 1 drop into both eyes daily.   naproxen  sodium 220 MG tablet Commonly known as: ALEVE Take 220 mg by mouth daily as needed (pain).   sildenafil 20 MG tablet Commonly known as: REVATIO 3 tablets 1 hour prior to intercourse as needed What changed:  how much to take how to take this when to take this        Allergies: No Known Allergies  Family History: Family History  Problem Relation Age of Onset   Colon cancer Neg Hx     Social History:  reports that he has quit smoking. He has never used smokeless tobacco. He reports current alcohol use. He reports that he does not use drugs.   Physical Exam: BP 130/80   Pulse 74   Ht 5\' 10"  (1.778 m)   Wt 250 lb (113.4 kg)   BMI 35.87 kg/m   Constitutional:  Alert and oriented, No acute distress. HEENT: [place] AT, moist mucus membranes.  Trachea midline, no masses. Cardiovascular: No clubbing, cyanosis, or edema. Respiratory: Normal respiratory effort, no increased work of breathing. Psychiatric: Normal mood and affect.   Assessment & Plan:    1.  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  Erectile dysfunction Stable Sildenafil refill   [name], MD  [place] [address] [place], [place] [address] [phone]

## 2021-03-27 LAB — PSA: Prostate Specific Ag, Serum: 8.2 ng/mL — ABNORMAL HIGH (ref 0.0–4.0)

## 2021-03-29 ENCOUNTER — Encounter: Payer: Self-pay | Admitting: *Deleted

## 2021-09-24 ENCOUNTER — Other Ambulatory Visit: Payer: Medicare PPO

## 2021-09-24 DIAGNOSIS — C61 Malignant neoplasm of prostate: Secondary | ICD-10-CM

## 2021-09-25 LAB — PSA: Prostate Specific Ag, Serum: 10.1 ng/mL — ABNORMAL HIGH (ref 0.0–4.0)

## 2021-09-28 ENCOUNTER — Encounter: Payer: Self-pay | Admitting: Urology

## 2021-09-28 ENCOUNTER — Other Ambulatory Visit: Payer: Self-pay | Admitting: Urology

## 2021-09-28 DIAGNOSIS — C61 Malignant neoplasm of prostate: Secondary | ICD-10-CM

## 2021-10-16 ENCOUNTER — Ambulatory Visit
Admission: RE | Admit: 2021-10-16 | Discharge: 2021-10-16 | Disposition: A | Discharge status: Home / Self Care | Payer: Medicare PPO | Source: Ambulatory Visit | Attending: Urology | Admitting: Urology

## 2021-10-16 DIAGNOSIS — C61 Malignant neoplasm of prostate: Secondary | ICD-10-CM | POA: Diagnosis present

## 2021-10-16 MED ORDER — GADOBUTROL 1 MMOL/ML IV SOLN
10.0000 mL | Freq: Once | INTRAVENOUS | Status: AC | PRN
  Administered 2021-10-16: 10 mL via INTRAVENOUS

## 2021-10-21 ENCOUNTER — Telehealth: Payer: Self-pay | Admitting: Urology

## 2021-10-21 DIAGNOSIS — C61 Malignant neoplasm of prostate: Secondary | ICD-10-CM

## 2021-10-21 NOTE — Telephone Encounter (Signed)
I contacted Ronnie Romero to discuss his recent prostate MRI.  He has intermediate risk prostate cancer and elected active surveillance.  Recent rising PSA to 10.1 and repeat MRI was recommended.  He does have a new PI-RADS 4 lesion in the right anterior transition zone and anterior fibromuscular stroma.  There was also a PI-RADS 3 lesion in the right PZ from mid gland to apex.  No pelvic adenopathy or evidence of extracapsular disease  Based on his intermediate risk disease and new PI-RADS 4 lesion with a rising PSA I did recommend a repeat MR fusion biopsy.  He was in agreement and would like to schedule.

## 2021-12-08 ENCOUNTER — Other Ambulatory Visit: Payer: Self-pay | Admitting: Urology

## 2022-06-23 ENCOUNTER — Telehealth: Payer: Self-pay | Admitting: Urology

## 2022-06-23 NOTE — Telephone Encounter (Signed)
Looks like he has miss someappts. Under past. Please schedule from the for six month f/u

## 2022-06-23 NOTE — Telephone Encounter (Signed)
Patient called to inquire if he needs to schedule a follow up appointment or a lab appoinment. Please advise.

## 2022-07-05 ENCOUNTER — Other Ambulatory Visit: Payer: Self-pay | Admitting: *Deleted

## 2022-07-05 DIAGNOSIS — C61 Malignant neoplasm of prostate: Secondary | ICD-10-CM

## 2022-07-14 ENCOUNTER — Other Ambulatory Visit: Payer: Medicare PPO

## 2022-07-18 ENCOUNTER — Ambulatory Visit: Payer: Medicare PPO | Admitting: Urology

## 2022-07-27 ENCOUNTER — Other Ambulatory Visit: Payer: Medicare PPO

## 2022-07-27 DIAGNOSIS — C61 Malignant neoplasm of prostate: Secondary | ICD-10-CM

## 2022-07-28 LAB — PSA: Prostate Specific Ag, Serum: 11.8 ng/mL — ABNORMAL HIGH (ref 0.0–4.0)

## 2022-07-29 ENCOUNTER — Ambulatory Visit: Payer: Medicare PPO | Admitting: Urology

## 2022-08-11 ENCOUNTER — Encounter: Payer: Self-pay | Admitting: Urology

## 2022-08-11 ENCOUNTER — Ambulatory Visit: Payer: Medicare PPO | Admitting: Urology

## 2022-08-11 VITALS — BP 145/75 | HR 64 | Ht 70.0 in | Wt 242.0 lb

## 2022-08-11 DIAGNOSIS — N5201 Erectile dysfunction due to arterial insufficiency: Secondary | ICD-10-CM | POA: Diagnosis not present

## 2022-08-11 DIAGNOSIS — C61 Malignant neoplasm of prostate: Secondary | ICD-10-CM

## 2022-08-11 MED ORDER — SILDENAFIL CITRATE 20 MG PO TABS: ORAL_TABLET | ORAL | 1 refills | Status: DC

## 2022-08-11 NOTE — Progress Notes (Signed)
08/11/2022 2:59 PM   [name] 1945-06-29 [id number]  Referring provider: Renee Rival, NP [place],  [place] [address]  Chief Complaint  Patient presents with   Follow-up   Prostate Cancer      Urologic history: 1.  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  Erectile dysfunction             -On sildenafil   3.  BPH without lower urinary tract symptoms   HPI: 77 y.o. male presents for semiannual follow-up.  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    COLONOSCOPY  2014   tubulovillous adenoma at outside facility   COLONOSCOPY WITH PROPOFOL N/A 11/13/2020   Procedure: COLONOSCOPY WITH PROPOFOL;  Surgeon: Eloise Harman, DO;  [facility];   Service: Endoscopy;  Laterality: N/A;  9:30am   cornea surgery bilaterally     HERNIA REPAIR Left    LIVER CYST REMOVAL     RETINAL DETACHMENT SURGERY     bilaterally    Home Medications:  Allergies as of 08/11/2022   No Known Allergies      Medication List        Accurate as of August 11, 2022  2:59 PM. If you have any questions, ask your nurse or doctor.          acetaminophen 500 MG tablet Commonly known as: TYLENOL Take 500 mg by mouth daily as needed for moderate pain.   FML Forte 0.25 % ophthalmic suspension Generic drug: fluorometholone Place 1 drop into both eyes daily.   naproxen sodium 220 MG tablet Commonly known as: ALEVE Take 220 mg by mouth daily as needed (pain).   sildenafil 20 MG tablet Commonly known as: REVATIO 3 tablets 1 hour prior to intercourse as needed  Allergies: No Known Allergies  Family History: Family History  Problem Relation Age of Onset   Colon cancer Neg Hx     Social History:  reports that he has quit smoking. He has been exposed to tobacco smoke. He has never used smokeless tobacco. He reports current alcohol use. He reports that he does not use drugs.   Physical Exam: BP (!) 145/75   Pulse 64   Ht '5\' 10"'$  (1.778 m)   Wt 242 lb (109.8 kg)   BMI 34.72 kg/m   Constitutional:  Alert and oriented, No acute distress. HEENT: [place] AT Respiratory: Normal respiratory effort, no increased work of breathing. GU: Prostate 50 g, smooth without nodules Psychiatric: Normal mood and affect.   Assessment & Plan:    1.  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  Erectile dysfunction Stable Sildenafil refilled   SAbbie Sons MD  [facility] [address] [place] [phone]

## 2023-02-09 ENCOUNTER — Encounter: Payer: Self-pay | Admitting: Urology

## 2023-02-09 ENCOUNTER — Ambulatory Visit: Payer: Medicare PPO | Admitting: Urology

## 2023-02-09 VITALS — BP 122/69 | HR 50 | Wt 231.4 lb

## 2023-02-09 DIAGNOSIS — N5201 Erectile dysfunction due to arterial insufficiency: Secondary | ICD-10-CM | POA: Diagnosis not present

## 2023-02-09 DIAGNOSIS — C61 Malignant neoplasm of prostate: Secondary | ICD-10-CM

## 2023-02-09 LAB — URINALYSIS, COMPLETE
Bilirubin, UA: NEGATIVE
Glucose, UA: NEGATIVE
Ketones, UA: NEGATIVE
Leukocytes,UA: NEGATIVE
Nitrite, UA: NEGATIVE
Protein,UA: NEGATIVE
RBC, UA: NEGATIVE
Specific Gravity, UA: 1.015 (ref 1.005–1.030)
Urobilinogen, Ur: 0.2 mg/dL (ref 0.2–1.0)
pH, UA: 5.5 (ref 5.0–7.5)

## 2023-02-09 LAB — MICROSCOPIC EXAMINATION: Bacteria, UA: NONE SEEN

## 2023-02-09 NOTE — Progress Notes (Signed)
I, [name], acting as a scribe for Riki Altes, MD., have documented all relevant documentation on the behalf of Riki Altes, MD, as directed by Riki Altes, MD while in the presence of Riki Altes, MD.  02/09/2023 1:40 PM   Ronnie Romero December 05, 1945 [id number]  Referring provider: Erasmo Downer, NP [address] [place],  [place] [address]  Chief Complaint  Patient presents with   Follow-up   Prostate Cancer   Urologic history: 1.  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  Erectile dysfunction On sildenafil   3.  BPH without lower urinary tract symptoms  HPI: Ronnie Romero is a 77 y.o. male presents for a 6 month follow-up.  Doing well since last visit No bothersome LUTS; nocturia x3-4 Denies dysuria, gross hematuria Denies flank, abdominal or pelvic pain 6 month follow up PSA has not been drawn.  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    COLONOSCOPY  2014   tubulovillous adenoma at outside facility   COLONOSCOPY WITH PROPOFOL N/A 11/13/2020   Procedure: COLONOSCOPY WITH PROPOFOL;  Surgeon: Lanelle Bal, DO;  [facility];  Service: Endoscopy;  Laterality: N/A;  9:30am   cornea surgery bilaterally     HERNIA REPAIR Left    LIVER CYST REMOVAL     RETINAL DETACHMENT SURGERY     bilaterally    Home Medications:  Allergies as of 02/09/2023   No Known Allergies      Medication List        Accurate as of February 09, 2023  1:40 PM. If you have any questions, ask your nurse or doctor.          acetaminophen 500 MG tablet Commonly known as: TYLENOL Take 500 mg by mouth daily as needed for moderate pain.   FML Forte 0.25 % ophthalmic suspension Generic drug: fluorometholone Place 1 drop into both eyes daily.   naproxen sodium 220 MG tablet Commonly known as: ALEVE Take 220 mg by mouth daily as needed (pain).  sildenafil 20 MG tablet Commonly known as: REVATIO 3 tablets 1 hour prior to intercourse as needed        Allergies: No Known Allergies  Family History: Family History  Problem Relation Age of Onset   Colon cancer Neg Hx     Social History:  reports that he has quit smoking. He has been exposed to tobacco smoke. He has never used smokeless tobacco. He reports current alcohol use. He reports that he does not use drugs.   Physical Exam: BP 122/69   Pulse (!) 50   Wt 231 lb 6.4 oz (105 kg)   BMI 33.20 kg/m   Constitutional:  Alert and oriented, No acute distress. HEENT: [place] AT Respiratory: Normal respiratory effort, no increased work of breathing. Psychiatric: Normal mood and affect.   Assessment & Plan:    1.  Clinical T1c intermediate risk  prostate cancer (favorable) Confirmatory biopsy was negative PSA drawn today. Schedule 6 month follow-up for PSA/DRE   2.  Erectile dysfunction Stable Continues sildenafil   I have reviewed the above documentation for accuracy and completeness, and I agree with the above.   Riki Altes, MD  [facility] [address], [address] [place], [place] [address] [phone]

## 2023-02-10 LAB — PSA: Prostate Specific Ag, Serum: 10.4 ng/mL — ABNORMAL HIGH (ref 0.0–4.0)

## 2023-02-11 ENCOUNTER — Encounter: Payer: Self-pay | Admitting: Urology

## 2023-08-08 ENCOUNTER — Other Ambulatory Visit: Payer: Self-pay | Admitting: *Deleted

## 2023-08-08 DIAGNOSIS — C61 Malignant neoplasm of prostate: Secondary | ICD-10-CM

## 2023-08-09 ENCOUNTER — Other Ambulatory Visit: Payer: Medicare PPO

## 2023-08-09 DIAGNOSIS — C61 Malignant neoplasm of prostate: Secondary | ICD-10-CM

## 2023-08-10 ENCOUNTER — Ambulatory Visit: Payer: Medicare PPO | Admitting: Urology

## 2023-08-10 LAB — PSA: Prostate Specific Ag, Serum: 11 ng/mL — ABNORMAL HIGH (ref 0.0–4.0)

## 2023-08-11 ENCOUNTER — Ambulatory Visit: Payer: Medicare PPO | Admitting: Urology

## 2023-08-14 ENCOUNTER — Encounter: Payer: Self-pay | Admitting: Urology

## 2023-08-14 ENCOUNTER — Ambulatory Visit: Payer: Medicare PPO | Admitting: Urology

## 2023-08-14 VITALS — BP 136/76 | HR 62 | Ht 70.0 in | Wt 232.0 lb

## 2023-08-14 DIAGNOSIS — N5201 Erectile dysfunction due to arterial insufficiency: Secondary | ICD-10-CM

## 2023-08-14 DIAGNOSIS — C61 Malignant neoplasm of prostate: Secondary | ICD-10-CM | POA: Diagnosis not present

## 2023-08-14 MED ORDER — SILDENAFIL CITRATE 20 MG PO TABS: ORAL_TABLET | ORAL | 1 refills | Status: DC

## 2023-08-14 NOTE — Progress Notes (Signed)
 I, [name], acting as a scribe for Riki Altes, MD., have documented all relevant documentation on the behalf of Riki Altes, MD, as directed by Riki Altes, MD while in the presence of Riki Altes, MD.  08/14/2023 12:03 PM   Ronnie Romero 1945-11-20 [id number]  Referring provider: Erasmo Downer, NP [address] [place],  [place] [address]  Urologic history: 1.  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  Erectile dysfunction On sildenafil   3.  BPH without lower urinary tract symptoms   HPI: Ronnie Romero is a 78 y.o. male presents for a 6 month follow-up.  Doing well since last visit No bothersome LUTS Denies dysuria, gross hematuria Denies flank, abdominal or pelvic pain PSA September 2024 had decreased to 10.4; most recent PSA 08/09/2023 was 11.0  PSA trend   Prostate Specific Ag, Serum  Latest Ref Rng 0.0 - 4.0 ng/mL  06/17/2019 6.1 (H)   12/09/2019 7.7 (H)   09/09/2020 7.7 (H)   03/26/2021 8.2 (H)   09/24/2021 10.1 (H)   07/27/2022 11.8 (H)   02/09/2023 10.4 (H)   08/09/2023 11.0 (H)      PMH: Past Medical History:  Diagnosis Date   Apnea, sleep 06/25/2015   Overview:  Uses CPAP   BPH (benign prostatic hyperplasia)    ED (erectile dysfunction)    Elevated PSA    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    COLONOSCOPY  2014   tubulovillous adenoma at outside facility   COLONOSCOPY WITH PROPOFOL N/A 11/13/2020   Procedure: COLONOSCOPY WITH PROPOFOL;  Surgeon: Lanelle Bal, DO;  Location: [facility];  Service: Endoscopy;  Laterality: N/A;  9:30am   cornea surgery bilaterally     HERNIA REPAIR Left    LIVER CYST REMOVAL     RETINAL DETACHMENT SURGERY     bilaterally    Home Medications:  Allergies as of 08/14/2023   No Known Allergies      Medication List        Accurate as of August 14, 2023 12:03 PM. If you have any questions, ask your nurse or doctor.          acetaminophen 500 MG tablet Commonly known as: TYLENOL Take 500 mg by mouth daily as needed for moderate pain.   FML Forte 0.25 % ophthalmic suspension Generic drug: fluorometholone Place 1 drop into both eyes daily.   naproxen sodium 220 MG tablet Commonly known as: ALEVE Take 220 mg by mouth daily as needed (pain).   sildenafil 20 MG tablet Commonly known as: REVATIO 3 tablets 1 hour prior to intercourse as  needed        Allergies: No Known Allergies  Family History: Family History  Problem Relation Age of Onset   Colon cancer Neg Hx     Social History:  reports that he has quit smoking. He has been exposed to tobacco smoke. He has never used smokeless tobacco. He reports current alcohol use. He reports that he does not use drugs.   Physical Exam: BP 136/76   Pulse 62   Ht 5\' 10"  (1.778 m)   Wt 232 lb (105.2 kg)   BMI 33.29 kg/m   Constitutional:  Alert and oriented, No acute distress. HEENT: [place] AT, moist mucus membranes.  Trachea midline, no masses. Cardiovascular: No clubbing, cyanosis, or edema. Respiratory: Normal respiratory effort, no increased work of breathing. GI: Abdomen is soft, nontender, nondistended, no  abdominal masses GU: Prostate 50 grams, smooth without nodules. Skin: No rashes, bruises or suspicious lesions. Neurologic: Grossly intact, no focal deficits, moving all 4 extremities. Psychiatric: Normal mood and affect.  Assessment & Plan:    1.  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   2. Erectile dysfunction Stable Sildenafil was refilled.  [facility] [address] [place], [place] [address] [phone]

## 2023-11-07 ENCOUNTER — Other Ambulatory Visit: Payer: Self-pay | Admitting: Nurse Practitioner

## 2023-11-07 DIAGNOSIS — Z136 Encounter for screening for cardiovascular disorders: Secondary | ICD-10-CM

## 2023-11-15 ENCOUNTER — Ambulatory Visit
Admission: RE | Admit: 2023-11-15 | Discharge: 2023-11-15 | Disposition: A | Discharge status: Home / Self Care | Source: Ambulatory Visit | Attending: Nurse Practitioner | Admitting: Nurse Practitioner

## 2023-11-15 DIAGNOSIS — Z136 Encounter for screening for cardiovascular disorders: Secondary | ICD-10-CM | POA: Insufficient documentation

## 2024-02-14 ENCOUNTER — Other Ambulatory Visit

## 2024-02-14 DIAGNOSIS — C61 Malignant neoplasm of prostate: Secondary | ICD-10-CM

## 2024-02-14 DIAGNOSIS — N5201 Erectile dysfunction due to arterial insufficiency: Secondary | ICD-10-CM

## 2024-02-15 LAB — PSA: Prostate Specific Ag, Serum: 10.5 ng/mL — ABNORMAL HIGH (ref 0.0–4.0)

## 2024-02-16 ENCOUNTER — Ambulatory Visit: Payer: Self-pay | Admitting: Urology

## 2024-04-18 ENCOUNTER — Other Ambulatory Visit: Payer: Self-pay | Admitting: Urology

## 2024-08-13 ENCOUNTER — Other Ambulatory Visit

## 2024-08-21 ENCOUNTER — Ambulatory Visit: Admitting: Urology
